# Patient Record
Sex: Male | Born: 1965
Health system: Southern US, Community
[De-identification: ages and names within clinical notes are randomized; demographics above are authoritative.]

## PROBLEM LIST (undated history)

## (undated) DIAGNOSIS — F419 Anxiety disorder, unspecified: Secondary | ICD-10-CM

## (undated) HISTORY — DX: Anxiety disorder, unspecified: F41.9

---

## 1998-02-20 ENCOUNTER — Emergency Department (HOSPITAL_COMMUNITY): Admission: EM | Admit: 1998-02-20 | Discharge: 1998-02-20 | Payer: Self-pay | Admitting: Emergency Medicine

## 2000-09-08 ENCOUNTER — Encounter: Payer: Self-pay | Admitting: Internal Medicine

## 2000-09-08 ENCOUNTER — Ambulatory Visit (HOSPITAL_COMMUNITY): Admission: RE | Admit: 2000-09-08 | Discharge: 2000-09-08 | Payer: Self-pay | Admitting: Internal Medicine

## 2002-10-12 HISTORY — PX: SHOULDER SURGERY: SHX246

## 2003-10-13 HISTORY — PX: FACIAL RECONSTRUCTION SURGERY: SHX631

## 2004-10-01 ENCOUNTER — Ambulatory Visit: Payer: Self-pay | Admitting: Internal Medicine

## 2005-06-25 ENCOUNTER — Ambulatory Visit: Payer: Self-pay | Admitting: Internal Medicine

## 2005-08-28 ENCOUNTER — Ambulatory Visit: Payer: Self-pay | Admitting: Internal Medicine

## 2006-03-30 ENCOUNTER — Ambulatory Visit: Payer: Self-pay | Admitting: Internal Medicine

## 2006-06-09 ENCOUNTER — Ambulatory Visit: Payer: Self-pay | Admitting: Internal Medicine

## 2006-08-05 ENCOUNTER — Ambulatory Visit: Payer: Self-pay | Admitting: Internal Medicine

## 2006-09-29 ENCOUNTER — Ambulatory Visit: Payer: Self-pay | Admitting: Internal Medicine

## 2007-01-06 ENCOUNTER — Ambulatory Visit: Payer: Self-pay | Admitting: Internal Medicine

## 2007-08-11 ENCOUNTER — Encounter (INDEPENDENT_AMBULATORY_CARE_PROVIDER_SITE_OTHER): Payer: Self-pay | Admitting: *Deleted

## 2008-01-31 ENCOUNTER — Emergency Department (HOSPITAL_COMMUNITY): Admission: EM | Admit: 2008-01-31 | Discharge: 2008-02-01 | Payer: Self-pay | Admitting: Emergency Medicine

## 2011-05-08 ENCOUNTER — Ambulatory Visit (INDEPENDENT_AMBULATORY_CARE_PROVIDER_SITE_OTHER): Payer: 59 | Admitting: Internal Medicine

## 2011-05-08 ENCOUNTER — Encounter: Payer: Self-pay | Admitting: Internal Medicine

## 2011-05-08 DIAGNOSIS — R5381 Other malaise: Secondary | ICD-10-CM

## 2011-05-08 MED ORDER — CITALOPRAM HYDROBROMIDE 40 MG PO TABS: 40.0000 mg | ORAL_TABLET | Freq: Every day | ORAL | Status: DC

## 2011-05-08 NOTE — Patient Instructions (Signed)
Assess response to the citalopram 40 mg daily. Use the nonmedical interventions as well to raise the neurotransmitters.

## 2011-05-08 NOTE — Progress Notes (Signed)
Subjective:    Patient ID: Jermaine Kidd, male    DOB: 1966/02/09, 45 y.o.   MRN: [id number]  HPI Malaise Onset: 02/2011   Fatigue @ rest : yes   ; not worse  with exertion  Primarily motivational fatigue: yes  Symptoms: Fever/ chills : no  Night sweats: no                                                                                           Vision changes ( blurred/ double/ loss): no                                                                                                  Hoarseness or swallowing dysfunction: no                                                                                          Bowel changes( constipation/ diarrhea): no                                                                                      Weight change: yes, 20 # over 2-3 months   Exertional chest pain: no  Dyspnea on exertion: no  Cough: no  Hemoptysis: no  New medications: no  Leg swelling: no  Orthopnea: no  PND: no  Melena/ rectal bleeding: no  Adenopathy: no  Severe snoring: no  Daytime sleepiness: no  Skin / hair / nail changes: no  Temperature intolerance( heat/ cold) : heat intolerance  Feeling depressed: yes, "probably"  Anhedonia: yes, "somewhat"  Altered appetite: yes  Poor sleep/ Apnea : yes, restless but no apnea  Abnormal bruising / bleeding or enlarged lymph nodes: no                                                                          PMH/ FH of thyroid disease: no    He had been on Lexapro 20 mg for at least a year and a half; in March dose was dropped to 10 mg.  Intermittently  he has had panic attacks.      Review of Systems labs done at an [facility] showed a platelet count 100,000 on  04/29/2011. Repeat platelet count on 7/26 was 224,000 all other labs were normal.  He denies a constellation of headache, chest pain, flushing, and  diarrhea.     Objective:   Physical Exam he is very healthy-appearing; weight is optimal and he is well muscled.  Extraocular motion is intact with no lid lag. There is no scleral icterus  Range of motion the neck is normal; thyroid is normal to palpation with normal physiologic asymmetry.  Chest is clear to auscultation with no increased work of breathing  He exhibits an S4 with no significant murmurs or gallops.  Abdomen is flat and well muscled. No organomegaly or masses are noted  Extremities reveal no edema, cyanosis, clubbing.  Range of motion is normal; except for flexion contracture of the left fifth digit. He has no tremor.  Strength and tone are excellent. Deep tendon reflexes are normal.  Affect is somewhat flat; exhibits no signs of anxiety.  He has no lymphadenopathy about the neck or axilla.             Assessment & Plan:   #1 malaise, and this is most likely related to serotonin deficiency in the context of a decreased dose of the SSRI  #2 thrombocytopenia; I believe this is a lab errorr. Clinically he has no evidence of such  Plan: The pathophysiology of serotonin deficiency was discussed. Options include assessing response to a recently increased dose of Lexapro or considering a dual agent to address potential  norepinephrine deficiency as well.

## 2011-07-07 ENCOUNTER — Encounter: Payer: Self-pay | Admitting: Internal Medicine

## 2011-07-07 ENCOUNTER — Ambulatory Visit (INDEPENDENT_AMBULATORY_CARE_PROVIDER_SITE_OTHER): Payer: 59 | Admitting: Internal Medicine

## 2011-07-07 DIAGNOSIS — R030 Elevated blood-pressure reading, without diagnosis of hypertension: Secondary | ICD-10-CM

## 2011-07-07 DIAGNOSIS — R519 Headache, unspecified: Secondary | ICD-10-CM

## 2011-07-07 DIAGNOSIS — R51 Headache: Secondary | ICD-10-CM

## 2011-07-07 MED ORDER — TRAMADOL HCL 50 MG PO TABS: 50.0000 mg | ORAL_TABLET | Freq: Four times a day (QID) | ORAL | Status: AC | PRN

## 2011-07-07 NOTE — Progress Notes (Signed)
Subjective:    Patient ID: Jermaine Kidd, male    DOB: 12/09/1965, 45 y.o.   MRN: [id number]  HPI #1He does not feel that citalopram has been of any benefit; he states "I'm just not happy". He denies any specific adverse effects with the medication.  #2 HEADACHE : Onset: 07/02/2011; onset during sexual relations   Location: over crown  Quality: severe pain 9/20, constant since Frequency: 24/7  Precipitating factors: no  Prior treatment: NSAIDS with temporary benefit Associated Symptoms Nausea/vomiting: no  Photophobia/phonophobia: no  Tearing of eyes: no  Sinus pain/pressure: no  Family/ PMH of migraine: no  Red Flags Fever: no  Neck pain/stiffness: no  Vision/speech/swallow/hearing difficulty: no  Focal weakness/numbness: no  Altered mental status: no  Trauma: no  Stress: marital issues New type of headache: yes,   Anticoagulant use: no       Review of Systems     Objective:   Physical Exam Gen.: Healthy and well-nourished in appearance. Alert, appropriate and cooperative throughout exam. Head: Normocephalic without obvious abnormalities Eyes: No corneal or conjunctival inflammation noted. Pupils equal round reactive to light and accommodation. FOV normal. Extraocular motion intact. Vision grossly normal. Ears: External  ear exam reveals no significant lesions or deformities. Canals clear .TMs normal. Hearing is grossly normal bilaterally. Tuning fork exam normal Nose: External nasal exam reveals no deformity or inflammation. Nasal mucosa are pink and moist. No lesions or exudates noted.  Mouth: Oral mucosa and oropharynx reveal no lesions or exudates. Teeth in good repair.Minimal asymmetry of nasolabial fold Neck: No deformities, masses, or tenderness noted. Range of motion &. Thyroid normal. Lungs: Normal respiratory effort; chest expands symmetrically. Lungs are clear to auscultation without rales, wheezes, or increased work of breathing. Heart: Normal rate and  rhythm. Normal S1 and S2. No gallop, click, or rub. No murmur.                                                                        Musculoskeletal/extremities: No deformity or scoliosis noted of  the thoracic or lumbar spine; minimal asymmetry of thoracic muscles. No clubbing, cyanosis, edema, or deformity noted. Tone & strength  normal.Joints normal. Nail health  good. Vascular: Carotid & radial artery pulses are full and equal. No bruits present. Neurologic: Alert and oriented x3. Deep tendon reflexes symmetrical and normal. Very minor tremor of the hands. Gait including heel and toe walking is normal. Romberg testing and finger to nose are normal as well         Skin: Intact without suspicious lesions or rashes. Lymph: No cervical, axillary lymphadenopathy present. Psych: Mood and affect are normal. Normally interactive                                                                                         Assessment & Plan:  #1 headaches over the cranium; no neurologic findings  #  2 mild diastolic hypertension, no past history of hypertension  #3 lack of benefit to SSRI  Plan: See orders and recommendations

## 2011-07-07 NOTE — Patient Instructions (Signed)
Blood Pressure Goal  Ideally is an AVERAGE < 135/85. This AVERAGE should be calculated from @ least 5-7 BP readings taken @ different times of day on different days of week. You should not respond to isolated BP readings , but rather the AVERAGE for that week  Please keep a diary of your headaches . Document  each occurrence on the calendar with notation of : #1 any prodrome ( any non headache symptom such as marked fatigue,visual changes, ,etc ) which precedes actual headache ; #2) severity on 1-10 scale; #3) any triggers ( food/ drink,enviromenntal or weather changes ,physical or emotional stress) in 8-12 hour period prior to the headache; & #4) response to any medications or other intervention. Please review "Headache" @ [facility] for additional information.

## 2011-07-14 ENCOUNTER — Ambulatory Visit (INDEPENDENT_AMBULATORY_CARE_PROVIDER_SITE_OTHER): Payer: 59 | Admitting: Internal Medicine

## 2011-07-14 ENCOUNTER — Encounter: Payer: Self-pay | Admitting: Internal Medicine

## 2011-07-14 DIAGNOSIS — M549 Dorsalgia, unspecified: Secondary | ICD-10-CM

## 2011-07-14 DIAGNOSIS — F419 Anxiety disorder, unspecified: Secondary | ICD-10-CM

## 2011-07-14 DIAGNOSIS — R51 Headache: Secondary | ICD-10-CM

## 2011-07-14 DIAGNOSIS — F411 Generalized anxiety disorder: Secondary | ICD-10-CM

## 2011-07-14 MED ORDER — VENLAFAXINE HCL ER 75 MG PO CP24: 75.0000 mg | ORAL_CAPSULE | Freq: Every day | ORAL | Status: DC

## 2011-07-14 MED ORDER — CYCLOBENZAPRINE HCL 5 MG PO TABS: 5.0000 mg | ORAL_TABLET | ORAL | Status: AC

## 2011-07-14 NOTE — Patient Instructions (Signed)
Referral will be made to the headache clinic if these studies are negative and you  do not respond to medication.

## 2011-07-14 NOTE — Progress Notes (Signed)
  Subjective:    Patient ID: Jermaine Kidd, male    DOB: Oct 02, 1966, 45 y.o.   MRN: [id number]  HPI  BACK & NECK PAIN: Location: Left trapezius area   Onset: 3-4 days ago  Severity: up to 5 Pain is described as: dull pain  Worse with: supine with head on pillow    Better with: no relievers  Pain radiates to:up neck & superior occiput   Impaired range of motion: no  History of repetitive motion:  yes, 60 reps with medicine ball with neck radiation  History of overuse or hyperextension:  no  History of trauma:  no   Past history of similar problem:  no   Symptoms Numbness/tingling:  no  Weakness:  no  Red Flags Fever:  no  Headache:  yes, occipital as noted . A major trigger for headaches appears to be stress. The record 9/25 was reviewed. He states that he was joking when he said the headaches began with sexual relations. He has found no definite trigger other than possible stress and relationship to the back and neck pain. At this time the headaches seem to be associated with the upper back and neck symptoms rather than a separate problem. The Ultram did not help the headaches would simply cause constipation Bowel/bladder dysfunction:  no      Review of Systems     Objective:   Physical Exam  He is healthy and well muscled and in no acute distress. He describes discomfort related to the headaches. Pupils are equal round and reactive to light. Extraocular motion and field of vision are normal.  Cranial nerve exam is normal except for slight asymmetry of the left nasolabial fold.  He has no lymphadenopathy about the neck. Range of motion of the neck is normal.  There's no tenderness to palpation of the trapezius.  Deep tendon reflexes and strength are normal and extremities.  Gait, Romberg, and finger-nose testing are normal        Assessment & Plan:  #1 occipital headaches, essentially present approximately 70 % of the time by history. Neurologic exam remains  negative except for minimal nasolabial asymmetry. Because the duration and severity of symptoms, imaging was appropriate.  #2 back and neck pain, musculoskeletal  Plan: See orders and recommendations

## 2011-07-16 NOTE — Progress Notes (Signed)
Addended by: Candie Echevaria L on: 07/16/2011 09:04 AM   Modules accepted: Orders

## 2011-07-20 ENCOUNTER — Other Ambulatory Visit: Payer: 59

## 2012-07-05 ENCOUNTER — Ambulatory Visit: Payer: 59

## 2012-07-21 ENCOUNTER — Encounter: Payer: Self-pay | Admitting: Internal Medicine

## 2012-07-21 ENCOUNTER — Ambulatory Visit (INDEPENDENT_AMBULATORY_CARE_PROVIDER_SITE_OTHER): Payer: 59 | Admitting: Internal Medicine

## 2012-07-21 VITALS — BP 130/80 | HR 86 | Wt 169.6 lb

## 2012-07-21 DIAGNOSIS — F411 Generalized anxiety disorder: Secondary | ICD-10-CM

## 2012-07-21 DIAGNOSIS — B079 Viral wart, unspecified: Secondary | ICD-10-CM

## 2012-07-21 DIAGNOSIS — F419 Anxiety disorder, unspecified: Secondary | ICD-10-CM | POA: Insufficient documentation

## 2012-07-21 MED ORDER — DULOXETINE HCL 60 MG PO CPEP: 60.0000 mg | ORAL_CAPSULE | Freq: Every day | ORAL | Status: DC

## 2012-07-21 NOTE — Patient Instructions (Addendum)
If you activate My Chart; the results can be released to you as soon as they populate from the lab. If you choose not to use this program; the labs have to be reviewed, copied & mailed   causing a delay in getting the results to you. Please schedule a physical

## 2012-07-21 NOTE — Assessment & Plan Note (Signed)
Trial of Cymbalta; titrate as tolerated

## 2012-07-21 NOTE — Progress Notes (Signed)
  Subjective:    Patient ID: Jermaine Kidd, male    DOB: 1965/10/28, 46 y.o.   MRN: [id number]  HPI He does not feel that the Lexapro is effective in controlling anxiety or panic attacks time. He does state he does not want to go up on the dose. He's questioning other medication options.  He denies depression or suicidal ideation. He is not abusing alcohol or drugs   Review of Systems   He describes some decrease in near vision. He denies GERD vision, double vision,or loss of vision. He is not having palpitations, chest pain, flushing, or diarrhea.  There have been no new change in his hair skin or nails     Objective:   Physical Exam  Gen.:  well-nourished; in no acute distress Eyes: Extraocular motion intact; no lid lag or proptosis Neck: normal thyroid Heart: Normal rhythm and rate without significant murmur, gallop, or extra heart sounds Lungs: Chest clear to auscultation without rales,rales, wheezes Neuro:Deep tendon reflexes are equal and very brisk; no tremor  Skin: Warm and dry without significant lesions or rashes; no onycholysis Psych: Normally communicative and interactive; no abnormal mood or affect clinically.         Assessment & Plan:

## 2012-08-24 ENCOUNTER — Ambulatory Visit (INDEPENDENT_AMBULATORY_CARE_PROVIDER_SITE_OTHER): Payer: 59 | Admitting: Internal Medicine

## 2012-08-24 ENCOUNTER — Encounter: Payer: Self-pay | Admitting: Internal Medicine

## 2012-08-24 VITALS — BP 112/80 | HR 99 | Temp 98.0°F | Wt 165.8 lb

## 2012-08-24 DIAGNOSIS — F419 Anxiety disorder, unspecified: Secondary | ICD-10-CM

## 2012-08-24 DIAGNOSIS — F411 Generalized anxiety disorder: Secondary | ICD-10-CM

## 2012-08-24 DIAGNOSIS — H539 Unspecified visual disturbance: Secondary | ICD-10-CM

## 2012-08-24 DIAGNOSIS — N529 Male erectile dysfunction, unspecified: Secondary | ICD-10-CM

## 2012-08-24 DIAGNOSIS — I479 Paroxysmal tachycardia, unspecified: Secondary | ICD-10-CM

## 2012-08-24 MED ORDER — SILDENAFIL CITRATE 100 MG PO TABS: 50.0000 mg | ORAL_TABLET | Freq: Every day | ORAL | Status: DC | PRN

## 2012-08-24 MED ORDER — LORAZEPAM 0.5 MG PO TABS: ORAL_TABLET | ORAL | Status: DC

## 2012-08-24 NOTE — Patient Instructions (Addendum)
The urine studies will verify if there is  increased production of stress hormones (metanephrines and catecholamines).   If you activate My Chart; the results can be released to you as soon as they populate from the lab. If you choose not to use this program; the labs have to be reviewed, copied & mailed   causing a delay in getting the results to you.

## 2012-08-24 NOTE — Progress Notes (Signed)
  Subjective:    Patient ID: Jermaine Kidd, male    DOB: 1966/01/29, 46 y.o.   MRN: [id number]  HPI He describes some erectile dysfunction over the last 9-10 months; he denies decreased libido or generalized weakness.   Review of Systems  Cymbalta has been of benefit for his "racing mind". He still describes some intermittent anxiety.He has had rare panic attacks. Depression:no Loss of interest (Anhedonia):no Insomnia:intermittently Anorexia:no Fatigue:no Neurologic signs/symptoms: No headache, numbness and tingling, weakness Endocrinologic signs and symptoms: No hoarseness, weight change,  temperature intolerance, bowel changes, skin/hair,/nail changes.   He has noted some blurred vision and loss of vision  Medications/efficacy: See Problem List     Intermittent hemorrhoidal flares.     Objective:   Physical Exam Gen.: Healthy and well-nourished in appearance. Alert, appropriate and cooperative throughout exam. Head: Normocephalic without obvious abnormalities Eyes: No corneal or conjunctival inflammation noted. No lid lag or proptosis. Extraocular motion intact. Vision grossly normal.  Neck: No deformities, masses, or tenderness noted. Range of motion & Thyroid  normal. Lungs: Normal respiratory effort; chest expands symmetrically. Lungs are clear to auscultation without rales, wheezes, or increased work of breathing. Heart: Normal rate and rhythm. Normal S1 and S2. No gallop, click, or rub. During  the cardiac examination he developed a tachycardia with no murmur. This quickly resolved Abdomen: Bowel sounds normal; abdomen soft and nontender. No masses, organomegaly or hernias noted. Genitalia: Genitalia normal except for left granuloma   .                                                                                   Musculoskeletal/extremities:  No clubbing, cyanosis, edema, or deformity noted. Range of motion  normal .Tone & strength  normal.Joints normal. Nail health   good. Vascular: Carotid, radial artery, dorsalis pedis and  posterior tibial pulses are full and equal. No bruits present. Neurologic: Alert and oriented x3. Deep tendon reflexes symmetrical and normal. No tremor        Skin: Intact without suspicious lesions or rashes. Lymph: No cervical, axillary, or inguinal lymphadenopathy present. Psych: Mood and affect are normal. Normally interactive                                                                                        Assessment & Plan:   #1 ED #2 anxiety disorder with rare panic attacks  #3 subjective visual changes  # 4 paroxysmal tachycardia  Plan: See orders and recommendations

## 2012-08-30 ENCOUNTER — Other Ambulatory Visit: Payer: Self-pay

## 2012-08-30 NOTE — Telephone Encounter (Signed)
#  6 R X 5

## 2012-08-30 NOTE — Telephone Encounter (Signed)
Pt LMOVM stating had samples for Viagra now out and requesting Viagra Rx sent to [facility]. OV and last filled 08/24/12 PLz advise   MW

## 2012-08-31 MED ORDER — SILDENAFIL CITRATE 100 MG PO TABS: 50.0000 mg | ORAL_TABLET | Freq: Every day | ORAL | Status: DC | PRN

## 2012-09-27 ENCOUNTER — Telehealth: Payer: Self-pay | Admitting: Internal Medicine

## 2012-09-27 NOTE — Telephone Encounter (Signed)
I called patient and got recording " [employer] customer is not available, try call again later"

## 2012-09-27 NOTE — Telephone Encounter (Signed)
Patient states he has a question regarding the quantity of pills he is receiving on one of his prescriptions. He would like to get more pills, but did not specify what medication.

## 2012-09-28 NOTE — Telephone Encounter (Signed)
I called patient again and received same recording

## 2012-09-29 MED ORDER — TADALAFIL 20 MG PO TABS: ORAL_TABLET | ORAL | Status: DC

## 2012-09-29 NOTE — Telephone Encounter (Signed)
Refill done.  

## 2012-09-29 NOTE — Telephone Encounter (Signed)
Spoke with patient, patient will contact insurance company and call us back

## 2012-09-29 NOTE — Telephone Encounter (Signed)
Which agent will his company cover ; other option is [place]. [facility] [place] [phone] (toll-free).

## 2012-09-29 NOTE — Telephone Encounter (Signed)
Patient would like Cialis

## 2012-09-29 NOTE — Telephone Encounter (Signed)
Cialis 20 mg  1 every 3 days prn ; how many does he want

## 2012-09-29 NOTE — Telephone Encounter (Signed)
Called work number, unable to reach patient. Tried patient again on cell. Patient is requesting a different ED medication, patient states insurance will only give him # 3 pills per month and he uses 3 in 1 week. Patient would like another medication   Hopp please advise

## 2012-10-15 ENCOUNTER — Other Ambulatory Visit: Payer: Self-pay | Admitting: Internal Medicine

## 2012-11-02 ENCOUNTER — Other Ambulatory Visit: Payer: Self-pay | Admitting: Internal Medicine

## 2012-11-02 MED ORDER — DULOXETINE HCL 60 MG PO CPEP: 60.0000 mg | ORAL_CAPSULE | Freq: Every day | ORAL | Status: DC

## 2012-11-02 NOTE — Telephone Encounter (Signed)
RX sent

## 2012-11-02 NOTE — Telephone Encounter (Signed)
refill CYMBALTA 60 MG Take 1 capsule (60 mg total) by mouth daily.-- requesting 90-day supply no last fill date listed  Note NEW pharmacy is now [facility] updated demographics

## 2012-11-07 ENCOUNTER — Other Ambulatory Visit: Payer: Self-pay | Admitting: Internal Medicine

## 2013-04-02 ENCOUNTER — Encounter (HOSPITAL_BASED_OUTPATIENT_CLINIC_OR_DEPARTMENT_OTHER): Payer: Self-pay | Admitting: *Deleted

## 2013-04-02 ENCOUNTER — Emergency Department (HOSPITAL_BASED_OUTPATIENT_CLINIC_OR_DEPARTMENT_OTHER): Payer: 59

## 2013-04-02 ENCOUNTER — Emergency Department (HOSPITAL_BASED_OUTPATIENT_CLINIC_OR_DEPARTMENT_OTHER)
Admission: EM | Admit: 2013-04-02 | Discharge: 2013-04-02 | Disposition: A | Discharge status: Home / Self Care | Payer: 59 | Attending: Emergency Medicine | Admitting: Emergency Medicine

## 2013-04-02 DIAGNOSIS — M542 Cervicalgia: Secondary | ICD-10-CM | POA: Insufficient documentation

## 2013-04-02 DIAGNOSIS — R51 Headache: Secondary | ICD-10-CM | POA: Insufficient documentation

## 2013-04-02 DIAGNOSIS — F411 Generalized anxiety disorder: Secondary | ICD-10-CM | POA: Insufficient documentation

## 2013-04-02 DIAGNOSIS — Z79899 Other long term (current) drug therapy: Secondary | ICD-10-CM | POA: Insufficient documentation

## 2013-04-02 MED ORDER — ONDANSETRON HCL 4 MG/2ML IJ SOLN
4.0000 mg | Freq: Once | INTRAMUSCULAR | Status: AC
  Administered 2013-04-02: 4 mg via INTRAVENOUS
  Filled 2013-04-02: qty 2

## 2013-04-02 MED ORDER — METOCLOPRAMIDE HCL 5 MG/ML IJ SOLN
10.0000 mg | Freq: Once | INTRAMUSCULAR | Status: AC
  Administered 2013-04-02: 10 mg via INTRAVENOUS
  Filled 2013-04-02: qty 2

## 2013-04-02 MED ORDER — DIPHENHYDRAMINE HCL 50 MG/ML IJ SOLN
25.0000 mg | Freq: Once | INTRAMUSCULAR | Status: AC
  Administered 2013-04-02: 15:00:00 via INTRAVENOUS
  Filled 2013-04-02: qty 1

## 2013-04-02 MED ORDER — HYDROCODONE-ACETAMINOPHEN 5-325 MG PO TABS: 2.0000 | ORAL_TABLET | ORAL | Status: DC | PRN

## 2013-04-02 MED ORDER — DEXAMETHASONE SODIUM PHOSPHATE 10 MG/ML IJ SOLN
10.0000 mg | Freq: Once | INTRAMUSCULAR | Status: AC
  Administered 2013-04-02: 10 mg via INTRAVENOUS
  Filled 2013-04-02: qty 1

## 2013-04-02 MED ORDER — HYDROMORPHONE HCL PF 1 MG/ML IJ SOLN
1.0000 mg | Freq: Once | INTRAMUSCULAR | Status: AC
  Administered 2013-04-02: 1 mg via INTRAVENOUS
  Filled 2013-04-02: qty 1

## 2013-04-02 NOTE — ED Notes (Signed)
Sudden onset of severe headache appx 40 minutes ago. States that it is coming from the back of his neck. Also states that his left arm is tingling.

## 2013-04-02 NOTE — ED Provider Notes (Signed)
Medical screening examination/treatment/procedure(s) were conducted as a shared visit with non-physician practitioner(s) and myself.  I personally evaluated the patient during the encounter   Pt with sudden onset of HA during intercourse.  Pt has no photophobia, no fever, no meningismus on exam.  Non contrast head CT is normal.  Discussed with Dr. Jena Gauss as well who does not recommend MRI or CT with contrast.  Discussed LP after CT is gold standard, althouh I have less clinical suspicion given exam is benign and pt is well appearing overall.  Given risk versus benefits of LP such as post LP headache, false positive or traumatic tap, infection, bleeding - pt does not wish to pursue LP at this time.    Pt with h/o anxiety in the past, is on cymbalta and ativan.  Impression: HA  Jermaine Kidd. Jermaine Brunelle, MD 04/02/13 1454

## 2013-04-02 NOTE — ED Notes (Signed)
Family member asking how much longer until patient will be seen states is hurting so bad nearly in tears. Informed pts family member that provider signed up to see them so should not be much longer

## 2013-04-02 NOTE — ED Notes (Signed)
Patient transported to CT 

## 2013-04-02 NOTE — ED Provider Notes (Signed)
History     CSN: [id number]  Arrival date & time 04/02/13  1158   First MD Initiated Contact with Patient 04/02/13 1300      Chief Complaint  Patient presents with  . Headache    (Consider location/radiation/quality/duration/timing/severity/associated sxs/prior treatment) Patient is a 47 y.o. male presenting with headaches. The history is provided by the patient. No language interpreter was used.  Headache Pain location:  Occipital Radiates to:  L neck Severity currently:  10/10 Severity at highest:  10/10 Timing:  Constant Progression:  Worsening Chronicity:  New Context comment:  Pt was having intercourse Relieved by:  Nothing Associated symptoms: neck pain     Past Medical History  Diagnosis Date  . Anxiety disorder     onset age34    Past Surgical History  Procedure Laterality Date  . Shoulder surgery    . Facial reconstruction surgery      baseball injury    Family History  Problem Relation Age of Onset  . Alzheimer's disease Paternal Grandmother   . Alzheimer's disease Maternal Aunt     History  Substance Use Topics  . Smoking status: Never Smoker   . Smokeless tobacco: Not on file  . Alcohol Use: No      Review of Systems  HENT: Positive for neck pain.   Neurological: Positive for headaches.  All other systems reviewed and are negative.    Allergies  Review of patient's allergies indicates no known allergies.  Home Medications   Current Outpatient Rx  Name  Route  Sig  Dispense  Refill  . AMBULATORY NON FORMULARY MEDICATION      Protein powder: twice daily and post working out          . AMBULATORY NON FORMULARY MEDICATION      Charger Night (selinuim) one at night          . AMBULATORY NON FORMULARY MEDICATION      Charger Drive (N8/G95): 1 by mouth twice daily          . AMBULATORY NON FORMULARY MEDICATION      Charger cuts: for lean muscle mas and decrease fats          . DULoxetine (CYMBALTA) 60 MG capsule      1 by mouth daily    30 capsule   0   . LORazepam (ATIVAN) 0.5 MG tablet      1 q 8-12 hrs prn only   30 tablet   2   . Multiple Vitamin (MULTIVITAMIN) tablet   Oral   Take 1 tablet by mouth daily.          . Nutritional Supplements (DHEA PO)   Oral   Take by mouth 3 (three) times daily.           . tadalafil (CIALIS) 20 MG tablet      Take 1 tablet every 3 days as needed.   10 tablet   0   . VIAGRA 100 MG tablet      TAKE 1/2 TO 1 TABLET BY MOUTH DAILY AS NEEDED FOR ERECTILE DYSFUNCTION   6 tablet   4     Approved on 08/30/12     BP 145/90  Pulse 64  Temp(Src) 97.6 F (36.4 C) (Oral)  Resp 20  SpO2 100%  Physical Exam  Vitals reviewed. Constitutional: He is oriented to person, place, and time. He appears well-developed and well-nourished.  HENT:  Head: Normocephalic.  Right Ear: External ear normal.  Left Ear: External ear normal.  Nose: Nose normal.  Mouth/Throat: Oropharynx is clear and moist.  Eyes: Conjunctivae and EOM are normal. Pupils are equal, round, and reactive to light.  Neck: Normal range of motion. Neck supple.  Cardiovascular: Normal rate.   Pulmonary/Chest: Effort normal.  Abdominal: Soft.  Musculoskeletal: Normal range of motion.  Neurological: He is alert and oriented to person, place, and time. He has normal reflexes.  Skin: Skin is warm.  Psychiatric: He has a normal mood and affect.    ED Course  Procedures (including critical care time)  Labs Reviewed - No data to display Ct Head Wo Contrast  04/02/2013   *RADIOLOGY REPORT*  Clinical Data: Acute onset of severe headache.  CT HEAD WITHOUT CONTRAST  Technique:  Contiguous axial images were obtained from the base of the skull through the vertex without contrast.  Comparison: None.  Findings: There is no acute intracranial hemorrhage, infarction, or mass lesion.  Brain parenchyma is normal.  Osseous structures are normal except for a tiny retention cyst in the right maxillary  sinus.  IMPRESSION: Essentially normal exam.  Specifically, no evidence of subarachnoid hemorrhage.   Original Report Authenticated By: Francene Boyers, M.D.     1. Headache       MDM  Ct scan no evidence of subarachnoid.    Pt feels better after migrane cocktail.   Pt reports headache resolved.   Dr. Oletta Lamas in to see and examine.   Pt declined LP.   Pt advised if headache returns symptoms worsen.          Lonia Skinner [place], PA-C 04/03/13 0007

## 2013-05-03 ENCOUNTER — Other Ambulatory Visit: Payer: Self-pay

## 2013-05-03 MED ORDER — DULOXETINE HCL 60 MG PO CPEP: ORAL_CAPSULE | ORAL | Status: DC

## 2013-05-03 NOTE — Telephone Encounter (Signed)
RX sent via paper fax received by patient (from [facility])

## 2013-06-26 IMAGING — CT CT HEAD W/O CM
1 series · 16 of 30 positions shown, 20 images · non-contrast
Comparison: None.

CLINICAL DATA: Acute onset of severe headache.

CT HEAD WITHOUT CONTRAST
TECHNIQUE: Contiguous axial images were obtained from the base of
the skull through the vertex without contrast.

[Series 2: head 4.8 h37s · axial · 0.46mm/px · z∈[+1248,+1388]mm · 16 of 32 slices shown, 20 images]
[im 2/32  brain]
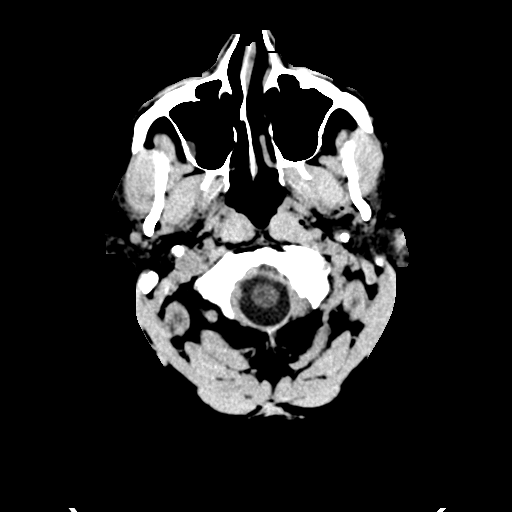
[im 2/32  bone]
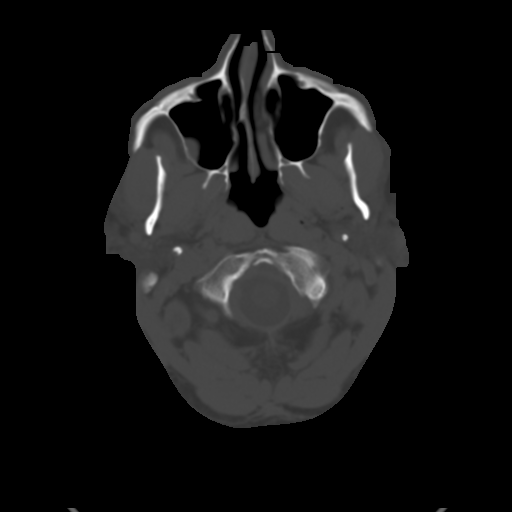
[im 4/32  brain]
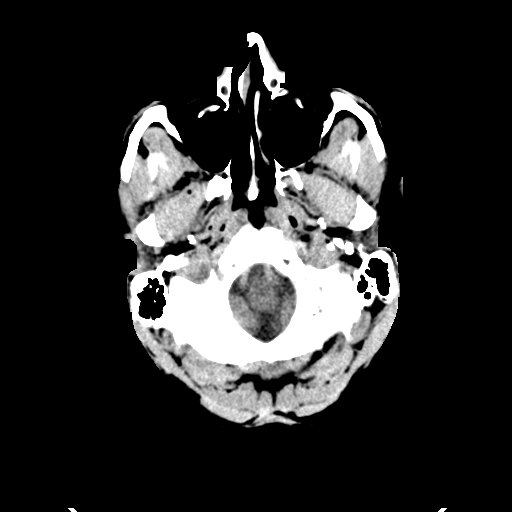
[im 6/32  brain]
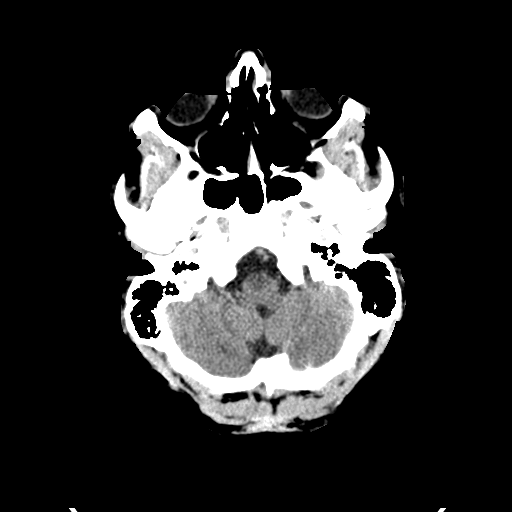
[im 8/32  brain]
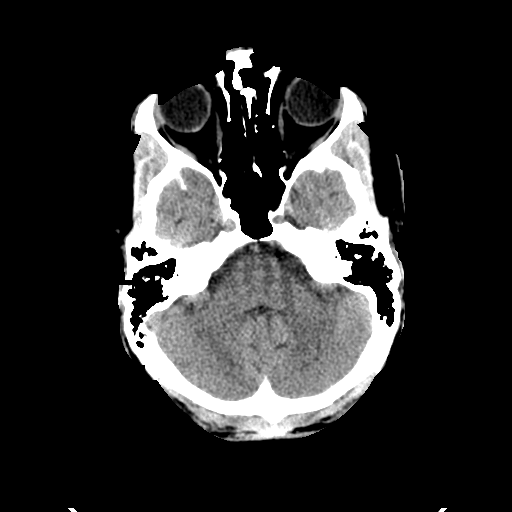
[im 9/32  brain]
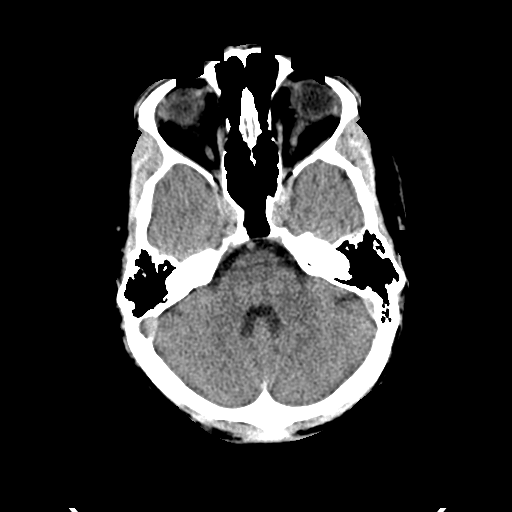
[im 9/32  bone]
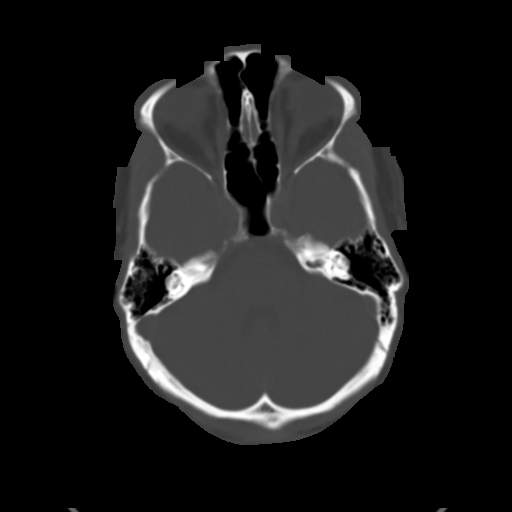
[im 11/32  brain]
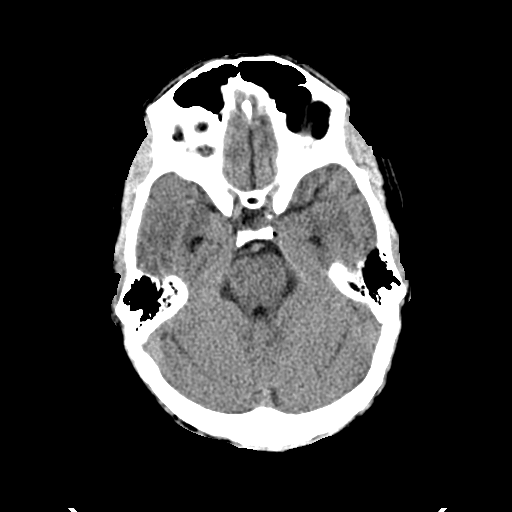
[im 13/32  brain]
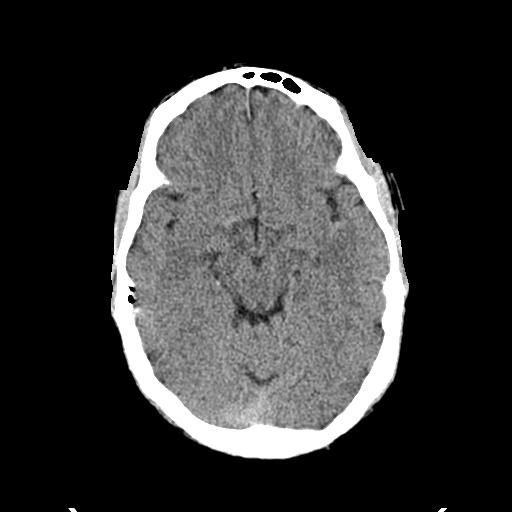
[im 15/32  brain]
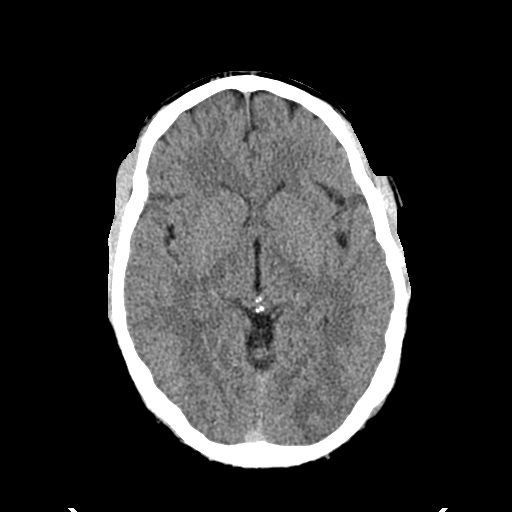
[im 17/32  brain]
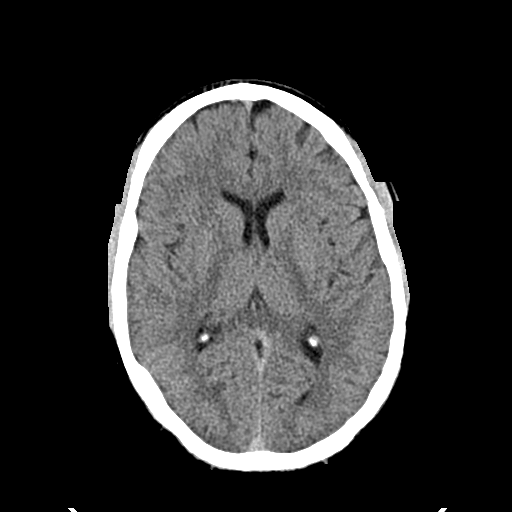
[im 17/32  bone]
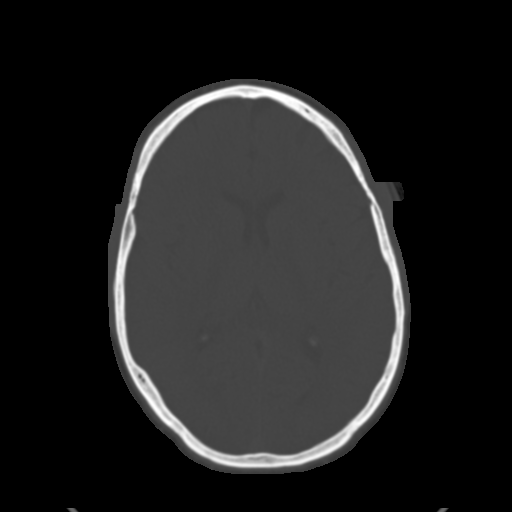
[im 19/32  brain]
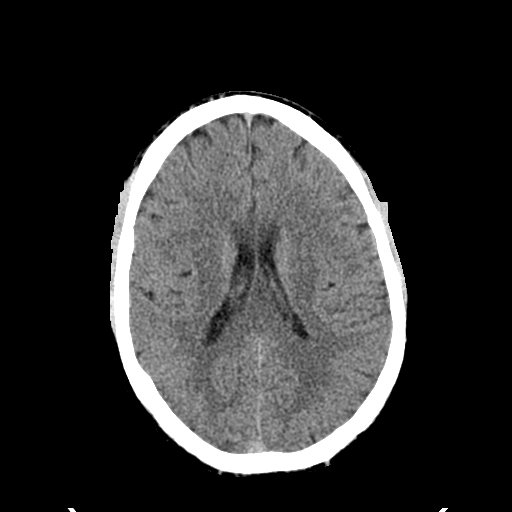
[im 21/32  brain]
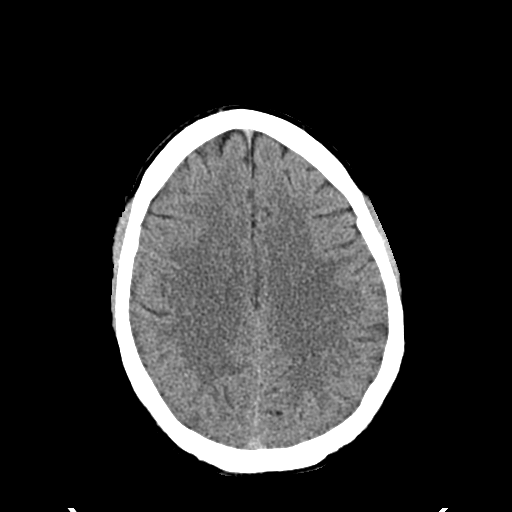
[im 23/32  brain]
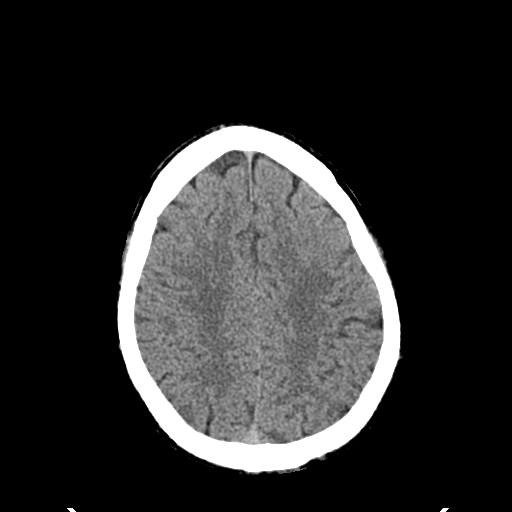
[im 24/32  brain]
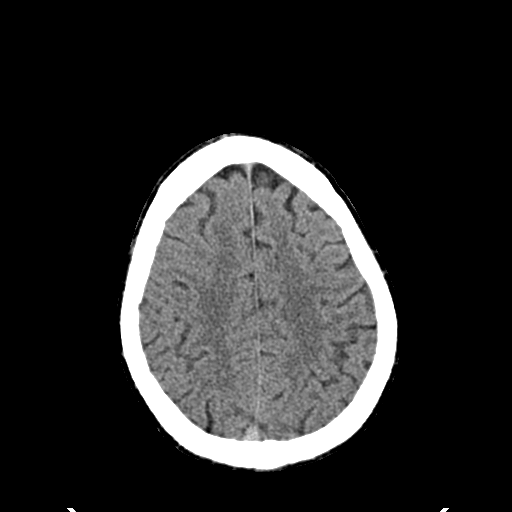
[im 24/32  bone]
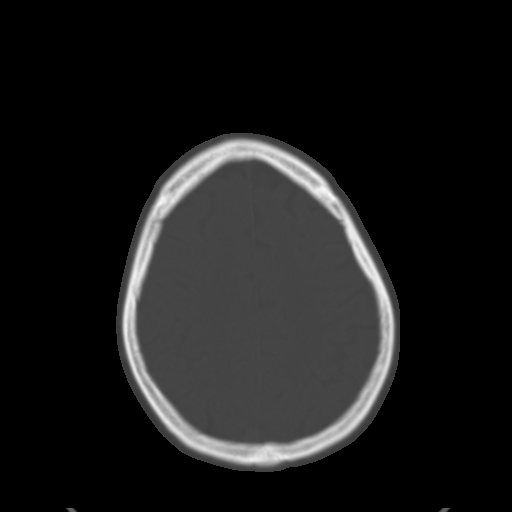
[im 26/32  brain]
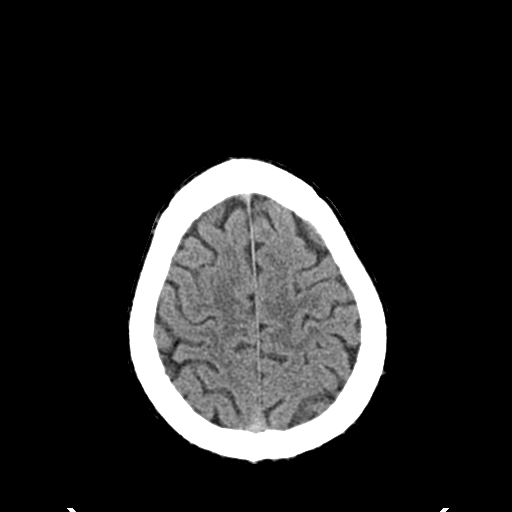
[im 28/32  brain]
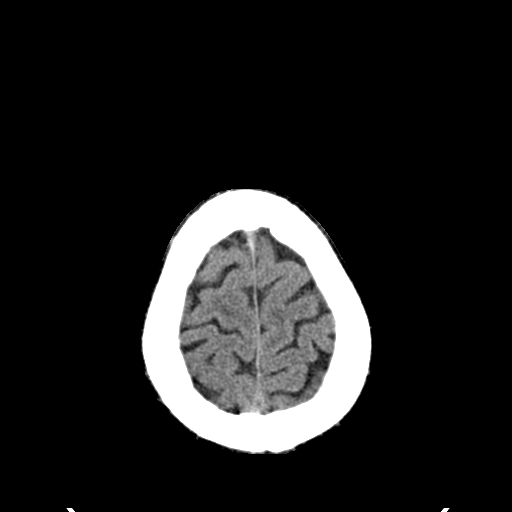
[im 30/32  brain]
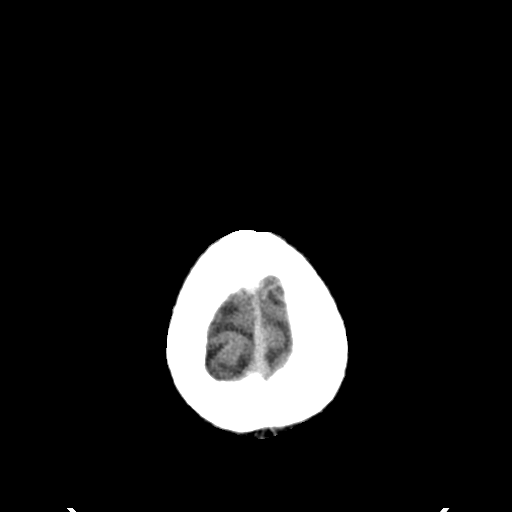

[16 of 30 positions shown; findings below may reference images not displayed]

FINDINGS: There is no acute intracranial hemorrhage, infarction, or
mass lesion.  Brain parenchyma is normal.  Osseous structures are
normal except for a tiny retention cyst in the right maxillary
sinus.
IMPRESSION: Essentially normal exam.  Specifically, no evidence of subarachnoid
hemorrhage.

## 2013-08-15 ENCOUNTER — Encounter: Payer: Self-pay | Admitting: Internal Medicine

## 2013-08-15 ENCOUNTER — Ambulatory Visit (INDEPENDENT_AMBULATORY_CARE_PROVIDER_SITE_OTHER): Payer: 59 | Admitting: Internal Medicine

## 2013-08-15 VITALS — BP 134/83 | HR 98 | Temp 98.5°F

## 2013-08-15 DIAGNOSIS — F411 Generalized anxiety disorder: Secondary | ICD-10-CM

## 2013-08-15 DIAGNOSIS — F419 Anxiety disorder, unspecified: Secondary | ICD-10-CM

## 2013-08-15 MED ORDER — VORTIOXETINE HBR 10 MG PO TABS: 10.0000 mg | ORAL_TABLET | ORAL | Status: DC

## 2013-08-15 NOTE — Progress Notes (Signed)
  Subjective:    Patient ID: Jermaine Kidd, male    DOB: 04-Jul-1966, 47 y.o.   MRN: [id number]  HPI  Over the last 1.5-2 months he has felt less happy and more anxious despite taking generic Cymbalta 60 mg daily. It ran out approximately a week ago. He states he feels worse off the medication.  He describes increased worrying & anxiety. He denies frank depression but describes himself as feeling "blah" there is no suicidal ideation.  He previously been on Paxil but this caused weight gain. He felt he became intolerant of Lexapro. He felt Effexor  did not work.  In November 2013 24 urine for metanephrines and catecholamines were recommended along with free T4 and TSH. These were never completed.    Review of Systems  No difficulty focusing;no PMH of behavioral issues  Weight stable. Intermittent sleep disruption as frequent awakenings.Some fatigue w/o  weakness Occasional heart racing or skipping @ rest.No sweating No nausea or vomiting,loss of appetite,constipation, diarrhea No tremor No change in skin/hair/nails  He denies a constellation of headaches, flushing, chest pain, & diarrhea        Objective:   Physical Exam Appears healthy and well-nourished & in no acute distress  No lid lag; EOM intact  No carotid bruits are present.No neck pain distention present at 10 - 15 degrees. Thyroid normal to palpation  Heart rhythm and rate are normal with no significant murmurs or gallops.  Chest is clear with no increased work of breathing  There is no evidence of aortic aneurysm or renal artery bruits  Abdomen well muscled with no organomegaly or masses. No HJR  No clubbing, cyanosis or edema present.  Pedal pulses are intact   No ischemic skin changes are present . Nails healthy without onycholysis   Alert and oriented. Strength, tone, DTRs reflexes normal. No tremor           Assessment & Plan:  #1 anxiety with subjective intolerance to generic Cymbalta  Plan:  See orders

## 2013-08-15 NOTE — Patient Instructions (Addendum)
Your next office appointment will be determined based upon review of your pending labs . Those instructions will be transmitted to you through My Chart  or by mail if you're not using this system.   Please report any significant change in your symptoms. 

## 2013-08-20 ENCOUNTER — Encounter: Payer: Self-pay | Admitting: Internal Medicine

## 2013-08-21 ENCOUNTER — Telehealth: Payer: Self-pay | Admitting: Internal Medicine

## 2013-08-21 NOTE — Telephone Encounter (Signed)
Patient called and wanted to see if dr hopper would write him a rx for DULoxetine (CYMBALTA) 60 MG capsule but for a higher dosage. He also stated that dr hopper prescribe him something else in place of the cymbalta but he states that it dose not work.    Pharmacy [id number] - [place], [place] - [address] AT [address]

## 2013-08-22 NOTE — Telephone Encounter (Signed)
Patient is calling back to check the status of his request below. States that he has been out for 3 days. Please advise.

## 2013-08-23 ENCOUNTER — Other Ambulatory Visit: Payer: Self-pay | Admitting: Internal Medicine

## 2013-08-23 MED ORDER — DULOXETINE HCL 60 MG PO CPEP: ORAL_CAPSULE | ORAL | Status: DC

## 2013-08-23 NOTE — Telephone Encounter (Signed)
Please advise 

## 2013-08-23 NOTE — Telephone Encounter (Signed)
Med refilled.

## 2013-08-23 NOTE — Telephone Encounter (Signed)
Stop Brintellix; restart Cymbalta 60 mg qd. This is max dose; only Psychiatrists can prescribe larger dose would require . See letter

## 2013-09-30 ENCOUNTER — Other Ambulatory Visit: Payer: Self-pay | Admitting: Internal Medicine

## 2013-11-02 ENCOUNTER — Other Ambulatory Visit: Payer: Self-pay | Admitting: Internal Medicine

## 2013-11-06 NOTE — Telephone Encounter (Signed)
Viagra refilled per protocol. JG//CMA

## 2013-11-10 ENCOUNTER — Other Ambulatory Visit: Payer: Self-pay | Admitting: Internal Medicine

## 2013-11-10 NOTE — Telephone Encounter (Signed)
Duloxetine refilled per protocol. JG//CMA 

## 2014-01-12 ENCOUNTER — Other Ambulatory Visit: Payer: Self-pay | Admitting: Internal Medicine

## 2014-01-25 ENCOUNTER — Other Ambulatory Visit: Payer: Self-pay | Admitting: Internal Medicine

## 2014-01-26 NOTE — Telephone Encounter (Signed)
Last OV 08/15/2013 Med last filled 11/10/13 #90 no refills

## 2014-02-19 ENCOUNTER — Other Ambulatory Visit (INDEPENDENT_AMBULATORY_CARE_PROVIDER_SITE_OTHER): Payer: 59

## 2014-02-19 ENCOUNTER — Other Ambulatory Visit: Payer: Self-pay | Admitting: Internal Medicine

## 2014-02-19 ENCOUNTER — Telehealth: Payer: Self-pay

## 2014-02-19 DIAGNOSIS — F411 Generalized anxiety disorder: Secondary | ICD-10-CM

## 2014-02-19 LAB — T4, FREE: FREE T4: 0.94 ng/dL (ref 0.60–1.60)

## 2014-02-19 LAB — TSH: TSH: 0.86 u[IU]/mL (ref 0.35–4.50)

## 2014-02-19 NOTE — Telephone Encounter (Signed)
Lab called requesting order be placed, patient is in the lab now.

## 2014-02-19 NOTE — Telephone Encounter (Signed)
Patient called [facility] requesting to have lab orders entered. Patient last seen 08/15/13 with no upcoming appointment scheduled, unsure what labs are needed. Thanks

## 2014-02-19 NOTE — Telephone Encounter (Signed)
Orders in chart from 11/14

## 2014-02-19 NOTE — Telephone Encounter (Signed)
Pt.notified

## 2014-02-20 ENCOUNTER — Encounter: Payer: Self-pay | Admitting: Internal Medicine

## 2014-02-22 ENCOUNTER — Ambulatory Visit (INDEPENDENT_AMBULATORY_CARE_PROVIDER_SITE_OTHER): Payer: 59 | Admitting: Internal Medicine

## 2014-02-22 ENCOUNTER — Encounter: Payer: Self-pay | Admitting: Internal Medicine

## 2014-02-22 VITALS — BP 110/78 | HR 96 | Temp 98.4°F | Resp 14 | Wt 180.8 lb

## 2014-02-22 DIAGNOSIS — R5381 Other malaise: Secondary | ICD-10-CM

## 2014-02-22 DIAGNOSIS — R4681 Obsessive-compulsive behavior: Secondary | ICD-10-CM

## 2014-02-22 DIAGNOSIS — F429 Obsessive-compulsive disorder, unspecified: Secondary | ICD-10-CM

## 2014-02-22 DIAGNOSIS — R5383 Other fatigue: Secondary | ICD-10-CM

## 2014-02-22 DIAGNOSIS — M6281 Muscle weakness (generalized): Secondary | ICD-10-CM

## 2014-02-22 DIAGNOSIS — N529 Male erectile dysfunction, unspecified: Secondary | ICD-10-CM

## 2014-02-22 NOTE — Progress Notes (Signed)
Pre visit review using our clinic review tool, if applicable. No additional management support is needed unless otherwise documented below in the visit note. 

## 2014-02-22 NOTE — Progress Notes (Signed)
Subjective:    Patient ID: Jermaine Kidd, male    DOB: 1966/09/08, 48 y.o.   MRN: [id number]  HPI  He is concerned that he has  testosterone deficiency; this is based on reviewing the data on Web M.D. He has multiple symptoms that he found on the list for testosterone deficiency.  Thyroid function tests were normal. To date he has not completed the 24 urine for catecholamines and metanephrines.     Review of Systems  He has difficulty building muscle mass despite taking creatine and other non steroidal or growth hormone supplements. He also describes significant fatigue at night.  He has difficulty getting and keeping erection.  He describes feeling more obsessive ,irritable & panicky despite taking the Cymbalta. He repeatedly washes his hands and does not touch doorknobs or pins used by the general public. Also he'll not touch the condiments in restaurants. He does recheck the doors after they had been locked at night.      Objective:   Physical Exam Gen.: Healthy, well muscled and well-nourished in appearance. Alert, appropriate and cooperative throughout exam. He obviously is uncomfortable discussing his symptoms related to possible obsessive behavior. Appears younger than stated age  Head: Normocephalic without obvious abnormalities Eyes: No corneal or conjunctival inflammation noted. Pupils equal round reactive to light and accommodation. Extraocular motion intact. No lid lag or proptosis  Nose: External nasal exam reveals no deformity or inflammation. Nasal mucosa are pink and moist. No lesions or exudates noted.   Mouth: Oral mucosa and oropharynx reveal no lesions or exudates. Teeth in good repair. Neck: No deformities, masses, or tenderness noted. Range of motion and Thyroid normal. Lungs: Normal respiratory effort; chest expands symmetrically. Lungs are clear to auscultation without rales, wheezes, or increased work of breathing. Heart: Normal rate and rhythm. Normal S1  and S2. No gallop, click, or rub. No murmur. Abdomen: Bowel sounds normal; abdomen soft and nontender. No masses, organomegaly or hernias noted. Genitalia: Genitalia normal                        Musculoskeletal/extremities: No deformity or scoliosis noted of  the thoracic or lumbar spine.  No clubbing, cyanosis, edema, or significant extremity  deformity noted. Range of motion normal .Tone & strength normal. Hand joints normal  Fingernail  health good. No onycholysis Able to lie down & sit up w/o help. Negative SLR bilaterally Vascular: Carotid, radial artery, dorsalis pedis and  posterior tibial pulses are full and equal. No bruits present. Neurologic: Alert and oriented x3. Deep tendon reflexes symmetrical and normal.  Gait normal   Skin: Intact without suspicious lesions or rashes. Lymph: No cervical, axillary, or inguinal lymphadenopathy present. Psych: Mood and affect are normal. Normally interactive .  The mood disorder questionnaire was completed. He answered 11 of 13 questions as positive. Several of these were noted as occurring @ the same time. Despite this he describes these  as "no problem". There is no family history of manic depressive or bipolar disorder.  Assessment & Plan:  #1 muscle weakness  #2 fatigue  #3 erectile dysfunction  #4 mood disorder, undiagnosed. Suboptimal response to Cymbalta  Plan: See orders. I have recommended consultation with a neuropsychiatrist for optimal therapeutic assessment.  He has agreed to complete the 24 urine for catecholamines and metanephrines.

## 2014-02-23 ENCOUNTER — Other Ambulatory Visit: Payer: Self-pay | Admitting: *Deleted

## 2014-02-23 ENCOUNTER — Telehealth: Payer: Self-pay | Admitting: *Deleted

## 2014-02-23 DIAGNOSIS — N529 Male erectile dysfunction, unspecified: Secondary | ICD-10-CM

## 2014-02-23 MED ORDER — SILDENAFIL CITRATE 100 MG PO TABS: ORAL_TABLET | ORAL | Status: DC

## 2014-02-23 MED ORDER — SILDENAFIL CITRATE 20 MG PO TABS: ORAL_TABLET | ORAL | Status: DC

## 2014-02-23 NOTE — Telephone Encounter (Signed)
Wrong viagra was printed...Jermaine Kidd

## 2014-02-23 NOTE — Telephone Encounter (Signed)
Message copied by Earnstine Regal on Fri Feb 23, 2014  2:15 PM ------      Message from: Hendricks Limes      Created: Fri Feb 23, 2014  5:26 AM       Please print R for generic Viagra for him to pick up Mon am after labs. Thanks ------

## 2014-02-23 NOTE — Telephone Encounter (Signed)
Faxed md request to dr. Casimiro Needle @ [phone]...Jermaine Kidd

## 2014-02-23 NOTE — Patient Instructions (Signed)
Fasting am labs Monday 02/26/14.

## 2014-02-23 NOTE — Telephone Encounter (Signed)
Printed rx md sign left up front for pick-up...Jermaine Kidd

## 2014-02-23 NOTE — Telephone Encounter (Signed)
Message copied by Earnstine Regal on Fri Feb 23, 2014  2:11 PM ------      Message from: Hendricks Limes      Created: Fri Feb 23, 2014  5:27 AM       Please FAX office visit & lab results (TSH pending) to DrPlovsky,Psych Thanks ------

## 2014-02-26 ENCOUNTER — Other Ambulatory Visit (INDEPENDENT_AMBULATORY_CARE_PROVIDER_SITE_OTHER): Payer: 59

## 2014-02-26 DIAGNOSIS — R5383 Other fatigue: Secondary | ICD-10-CM

## 2014-02-26 DIAGNOSIS — N529 Male erectile dysfunction, unspecified: Secondary | ICD-10-CM

## 2014-02-26 DIAGNOSIS — M6281 Muscle weakness (generalized): Secondary | ICD-10-CM

## 2014-02-26 DIAGNOSIS — R5381 Other malaise: Secondary | ICD-10-CM

## 2014-02-26 LAB — HEPATIC FUNCTION PANEL
ALBUMIN: 4.1 g/dL (ref 3.5–5.2)
ALT: 42 U/L (ref 0–53)
AST: 28 U/L (ref 0–37)
Alkaline Phosphatase: 54 U/L (ref 39–117)
Bilirubin, Direct: 0.1 mg/dL (ref 0.0–0.3)
TOTAL PROTEIN: 7.4 g/dL (ref 6.0–8.3)
Total Bilirubin: 0.9 mg/dL (ref 0.2–1.2)

## 2014-02-26 LAB — CBC WITH DIFFERENTIAL/PLATELET
BASOS ABS: 0 10*3/uL (ref 0.0–0.1)
BASOS PCT: 0.3 % (ref 0.0–3.0)
EOS ABS: 0.1 10*3/uL (ref 0.0–0.7)
Eosinophils Relative: 1.1 % (ref 0.0–5.0)
HCT: 46.7 % (ref 39.0–52.0)
HEMOGLOBIN: 15.9 g/dL (ref 13.0–17.0)
Lymphocytes Relative: 23.2 % (ref 12.0–46.0)
Lymphs Abs: 1.3 10*3/uL (ref 0.7–4.0)
MCHC: 34 g/dL (ref 30.0–36.0)
MCV: 92.1 fl (ref 78.0–100.0)
MONO ABS: 0.5 10*3/uL (ref 0.1–1.0)
Monocytes Relative: 8.6 % (ref 3.0–12.0)
NEUTROS ABS: 3.8 10*3/uL (ref 1.4–7.7)
Neutrophils Relative %: 66.8 % (ref 43.0–77.0)
Platelets: 247 10*3/uL (ref 150.0–400.0)
RBC: 5.08 Mil/uL (ref 4.22–5.81)
RDW: 12.7 % (ref 11.5–15.5)
WBC: 5.7 10*3/uL (ref 4.0–10.5)

## 2014-02-26 LAB — BASIC METABOLIC PANEL
BUN: 16 mg/dL (ref 6–23)
CALCIUM: 9.4 mg/dL (ref 8.4–10.5)
CHLORIDE: 105 meq/L (ref 96–112)
CO2: 29 meq/L (ref 19–32)
CREATININE: 1.2 mg/dL (ref 0.4–1.5)
GFR: 69.67 mL/min (ref >60.00)
GLUCOSE: 101 mg/dL — AB (ref 70–99)
Potassium: 4.7 mEq/L (ref 3.5–5.1)
SODIUM: 140 meq/L (ref 135–145)

## 2014-02-26 LAB — TESTOSTERONE: TESTOSTERONE: 302.69 ng/dL (ref 300.00–890.00)

## 2014-02-26 LAB — CK: CK TOTAL: 246 U/L — AB (ref 7–232)

## 2014-02-27 ENCOUNTER — Telehealth: Payer: Self-pay

## 2014-02-27 ENCOUNTER — Ambulatory Visit: Payer: 59

## 2014-02-27 DIAGNOSIS — R7309 Other abnormal glucose: Secondary | ICD-10-CM

## 2014-02-27 LAB — CATECHOLAMINES, FRACTIONATED, URINE, 24 HOUR

## 2014-02-27 LAB — HEMOGLOBIN A1C: HEMOGLOBIN A1C: 6.1 % (ref 4.6–6.5)

## 2014-02-27 NOTE — Telephone Encounter (Signed)
Message copied by Shelly Coss on Tue Feb 27, 2014  8:16 AM ------      Message from: Hendricks Limes      Created: Mon Feb 26, 2014  7:02 PM       Please add A1c (790.29)       ------

## 2014-02-27 NOTE — Telephone Encounter (Signed)
Request for add on lab has been faxed  

## 2014-03-01 ENCOUNTER — Encounter: Payer: Self-pay | Admitting: Internal Medicine

## 2014-03-01 ENCOUNTER — Other Ambulatory Visit: Payer: Self-pay | Admitting: Internal Medicine

## 2014-03-01 DIAGNOSIS — R7989 Other specified abnormal findings of blood chemistry: Secondary | ICD-10-CM | POA: Insufficient documentation

## 2014-03-12 ENCOUNTER — Encounter: Payer: Self-pay | Admitting: Internal Medicine

## 2014-03-13 ENCOUNTER — Encounter: Payer: Self-pay | Admitting: Internal Medicine

## 2014-03-13 NOTE — Telephone Encounter (Signed)
Message copied by Izola Price on Tue Mar 13, 2014  3:22 PM ------      Message from: Hendricks Limes      Created: Mon Mar 12, 2014  6:41 PM       Please ask the lab to obtain the results of the 24-hour urine catecholamines and metanephrines which were collected 3 weeks ago. Thank you ------

## 2014-03-13 NOTE — Telephone Encounter (Signed)
Spoke with lab and they are faxing results over

## 2014-03-22 ENCOUNTER — Encounter: Payer: Self-pay | Admitting: Internal Medicine

## 2014-03-28 ENCOUNTER — Other Ambulatory Visit: Payer: Self-pay | Admitting: Internal Medicine

## 2014-03-28 ENCOUNTER — Encounter: Payer: Self-pay | Admitting: Internal Medicine

## 2014-03-28 ENCOUNTER — Telehealth: Payer: Self-pay | Admitting: Internal Medicine

## 2014-03-28 DIAGNOSIS — N529 Male erectile dysfunction, unspecified: Secondary | ICD-10-CM

## 2014-03-28 MED ORDER — LORAZEPAM 0.5 MG PO TABS: 0.5000 mg | ORAL_TABLET | Freq: Three times a day (TID) | ORAL | Status: DC | PRN

## 2014-03-28 MED ORDER — SILDENAFIL CITRATE 20 MG PO TABS: ORAL_TABLET | ORAL | Status: DC

## 2014-03-28 NOTE — Telephone Encounter (Signed)
Scripts for Revatio and Ativan have been faxed to [facility] on [address]

## 2014-03-29 NOTE — Telephone Encounter (Signed)
Prior authorization initiated for sildenafil citrate. Awaiting determination. JG//CMA

## 2014-04-02 ENCOUNTER — Telehealth: Payer: Self-pay

## 2014-04-02 NOTE — Telephone Encounter (Signed)
Patient has been advised that his insurance is not covering Sildenafil 20 mg. Per Dr Linna Darner he can pay out of pocket and [facility]. He mentioned another pharmacy he would like to try. He's going to check with that pharmacy to make sure they supply it and will call the office back of where he wants the script to go.

## 2014-05-09 ENCOUNTER — Other Ambulatory Visit: Payer: Self-pay | Admitting: Internal Medicine

## 2014-05-09 NOTE — Telephone Encounter (Signed)
OK X1 

## 2014-07-01 ENCOUNTER — Encounter: Payer: Self-pay | Admitting: Internal Medicine

## 2014-07-01 DIAGNOSIS — F319 Bipolar disorder, unspecified: Secondary | ICD-10-CM | POA: Insufficient documentation

## 2014-07-18 ENCOUNTER — Encounter: Payer: Self-pay | Admitting: Internal Medicine

## 2014-08-06 ENCOUNTER — Encounter: Payer: Self-pay | Admitting: Internal Medicine

## 2014-08-18 ENCOUNTER — Other Ambulatory Visit: Payer: Self-pay | Admitting: Internal Medicine

## 2014-08-20 NOTE — Telephone Encounter (Signed)
OK X1 

## 2014-10-17 ENCOUNTER — Other Ambulatory Visit: Payer: Self-pay | Admitting: Internal Medicine

## 2014-10-17 NOTE — Telephone Encounter (Signed)
OK X1, R X 2

## 2014-11-01 ENCOUNTER — Encounter: Payer: Self-pay | Admitting: Internal Medicine

## 2014-11-01 ENCOUNTER — Ambulatory Visit (INDEPENDENT_AMBULATORY_CARE_PROVIDER_SITE_OTHER): Payer: 59 | Admitting: Internal Medicine

## 2014-11-01 VITALS — BP 132/74 | HR 89 | Temp 97.9°F | Resp 14 | Wt 183.8 lb

## 2014-11-01 DIAGNOSIS — G479 Sleep disorder, unspecified: Secondary | ICD-10-CM

## 2014-11-01 DIAGNOSIS — F3170 Bipolar disorder, currently in remission, most recent episode unspecified: Secondary | ICD-10-CM

## 2014-11-01 DIAGNOSIS — N528 Other male erectile dysfunction: Secondary | ICD-10-CM

## 2014-11-01 DIAGNOSIS — Z8489 Family history of other specified conditions: Secondary | ICD-10-CM

## 2014-11-01 DIAGNOSIS — Z87898 Personal history of other specified conditions: Secondary | ICD-10-CM

## 2014-11-01 MED ORDER — SILDENAFIL CITRATE 20 MG PO TABS: ORAL_TABLET | ORAL | Status: DC

## 2014-11-01 NOTE — Progress Notes (Signed)
   Subjective:    Patient ID: Jermaine Kidd, male    DOB: 1966-04-13, 49 y.o.   MRN: [id number]  HPI  He is here concerned about sleep dysfunction. He's been taking zolpidem 10 mg 3-4 times a week without benefit . This was prescribed by Dr. Toy Cookey, Psychiatrist.    He does drink 3-4 caffeinated diet drinks per day. He was previously on a stimulant containing caffeine which he's discontinued.  He does read in bed.  He's physically active at the gym with weights & cardiovascular exercise 5 days a week without cardiopulmonary symptoms  His wife describes occasional snoring as well as possible apnea.  He is being treated for bipolar disorder and takes clonazepam or lorazepam as needed. He's on fluoxamine 100 milligrams daily. He feels a full pill causes metallic taste in his mouth. His reduced it to a fourth of a pill with resolution of symptoms. His also is on Eszopiclone 2 mg one half-1 at bedtime.  He was on Seroquel but he said that he "lost days" on this  ED symptoms persist;he  is interested in pursuing the generic Viagra compound.  Review of Systems  He denies recent panic attacks. These have been manifested as if his chest were "pressing out" and being fearful.   Chest pain, palpitations, tachycardia, exertional dyspnea, paroxysmal nocturnal dyspnea, claudication or edema are absent.      Objective:   Physical Exam  General appearance:good health ;well nourished; no acute distress or increased work of breathing is present.  No  lymphadenopathy about the head, neck, or axilla noted.   Eyes: No conjunctival inflammation or lid edema is present. There is no scleral icterus.Unsustained nystagmus with superior gaze;EOMI  Ears:  External ear exam shows no significant lesions or deformities.  Otoscopic examination reveals clear canals, tympanic membranes are intact bilaterally without bulging, retraction, inflammation or discharge.  Nose:  External nasal examination shows no  deformity or inflammation. Nasal mucosa are pink and moist without lesions or exudates. No septal dislocation or deviation.No obstruction to airflow.   Oral exam: Dental hygiene is good; lips and gums are healthy appearing.There is no oropharyngeal erythema or exudate noted.   Neck:  No deformities, thyromegaly, masses, or tenderness noted.   Supple with full range of motion without pain.   Heart:  Normal rate and regular rhythm. S1 and S2 normal without gallop, murmur, click, rub or other extra sounds.   Lungs:Chest clear to auscultation; no wheezes, rhonchi,rales ,or rubs present.No increased work of breathing.    Extremities:  No cyanosis, edema, or clubbing  noted    Skin: Warm & dry w/o jaundice or tenting.       Assessment & Plan:  #1 sleep dysfunction  #2 erectile dysfunction Plan: Sleep Medicine specialty referral. Sleep hygiene interventions.

## 2014-11-01 NOTE — Patient Instructions (Addendum)
To prevent sleep dysfunction follow these instructions for sleep hygiene. Do not read, watch TV, or eat in bed. Do not get into bed until you are ready to turn off the light &  to go to sleep. Do not ingest stimulants ( decongestants, diet pills, nicotine, caffeine) after the evening meal.Do not take daytime naps.Continue cardiovascular exercise recommended 30-45 minutes 3-4 times per week. Alternatively cheaper therapeutically equivalent option for Viagra is available from  [facility] at [phone].

## 2014-11-01 NOTE — Progress Notes (Signed)
Pre visit review using our clinic review tool, if applicable. No additional management support is needed unless otherwise documented below in the visit note. 

## 2014-11-28 ENCOUNTER — Institutional Professional Consult (permissible substitution): Payer: Self-pay | Admitting: Neurology

## 2014-12-12 ENCOUNTER — Ambulatory Visit (INDEPENDENT_AMBULATORY_CARE_PROVIDER_SITE_OTHER): Payer: 59 | Admitting: Neurology

## 2014-12-12 ENCOUNTER — Encounter: Payer: Self-pay | Admitting: Neurology

## 2014-12-12 VITALS — BP 133/78 | HR 85 | Resp 14 | Ht 69.25 in | Wt 185.0 lb

## 2014-12-12 DIAGNOSIS — F4321 Adjustment disorder with depressed mood: Secondary | ICD-10-CM

## 2014-12-12 DIAGNOSIS — F5105 Insomnia due to other mental disorder: Secondary | ICD-10-CM | POA: Diagnosis not present

## 2014-12-12 DIAGNOSIS — F5102 Adjustment insomnia: Secondary | ICD-10-CM | POA: Diagnosis not present

## 2014-12-12 DIAGNOSIS — F329 Major depressive disorder, single episode, unspecified: Secondary | ICD-10-CM | POA: Diagnosis not present

## 2014-12-12 DIAGNOSIS — F32A Depression, unspecified: Secondary | ICD-10-CM

## 2014-12-12 MED ORDER — SUVOREXANT 15 MG PO TABS: 20.0000 mg | ORAL_TABLET | Freq: Every evening | ORAL | Status: DC

## 2014-12-12 NOTE — Progress Notes (Signed)
SLEEP MEDICINE CLINIC   Provider:  Larey Seat, M D  Referring Provider: Hendricks Limes, MD Primary Care Physician:  Unice Cobble, MD  Chief Complaint  Patient presents with  . NP [facility] Sleep consult, can't sleep    Rm 10, alone    HPI:  Jermaine Kidd is a 49 y.o. male seen here as a referral  from Dr. Unice Cobble for Insomnia,     Jermaine Kidd, who prefers to go by "Jermaine Kidd", seen here today upon request of Dr. Unice Cobble. He reports that he had difficulties falling asleep and staying asleep until Lunesta was prescribed for him. Lunesta helped for a while it was prescribed from about 3 months with good effect at the time at 2 mg. He would sometimes take a half pill a full pill. Then the medication seemed to have no longer had the same effect and he switch to Ambien. Ambien was prescribed in the generic form so P damp at 10 mg tablets. Here again he experienced initially a better result than over several weeks. The effect was again waxing and waning and he decided to cut the Ambien pill in half. Remarkably 5 mg work better for him than 10 mg . The baby was a pharmacokinetic aspect to why this is working for him. He has never been using an extended release form however. He also has been given Klonopin,  becauses of anxiety , prescribed by Dr. Toy Cookey.  And he has been on Prozac and Ativan for the same reason- it was also prescribed for anxiety and he was allowed to take up to 2  Ativan a day but because of its potential habit-forming side effects the patient has not used it in a while.  Jermaine Kidd reports how frustrating it was to deal with his chronic insomnia. Sometimes he would take Lunesta or Ambien and still lay awake for 6 or 7 hours. He felt almost that it was a paradoxical effect; what kept  other people asleep may have kept him awake.  He reports no nocturnal palpitations, but he feels highly anxious. He describes not truly panic attacks but an anxiety underlying his  experiences of the day. Looking back Jermaine Kidd feels that throughout his life he didn't need much sleep. However that insomnia became an issue began around 2015. He remembers that especially the Christmastime 2015 was bad for him. Sometimes he would ruminate about , if he took too long in the shower, if he paid bills. He has no financial or job difficulties, he is happily remarried for 18 month .  He has a chest pressure, a palpable pressure on his chest.   The patient and his wife usually go to bed between 10:30 or around that time, he will take an Ambien at the time also tablet and usually does not engage in anything swelling or very exciting. He tries to wean down and get relaxed. It may still take a long time to fall asleep , though. Currently he will rarely falls asleep before 11:30. Once asleep he will stay asleep for about 1 hour, then will wake up but has trouble to reinitiate sleep for the rest of the night. Often he just decides to stay up.  He may get get as little as one or 2 hours of sleep at night and the last weeks has been very difficult. He was on a trip to [facility] , when he had again insomnia problem. End of Febr. 2016.  He does  not take daytimes naps in the last month, but used to nap, he never flet this detracted form his nocturnal sleep. '   His father has anxiety and insomnia, chronically . He is asleep whenever his son looks at him ,but insists he is not.   Review of Systems: Out of a complete 14 system review, the patient complains of only the following symptoms, and all other reviewed systems are negative. insomnia, chronic.   Epworth score  12 , Fatigue severity score 38   , depression score 6 " always Know this will be a lousy day "    History   Social History  . Marital Status: Married    Spouse Name: [name]  . Number of Children: 4  . Years of Education: college   Occupational History  . Sales    Social History Main Topics  . Smoking status: Never Smoker     . Smokeless tobacco: Not on file  . Alcohol Use: No  . Drug Use: No  . Sexual Activity: Not on file   Other Topics Concern  . Not on file   Social History Narrative    Family History  Problem Relation Age of Onset  . Alzheimer's disease Paternal Grandmother   . Alzheimer's disease Maternal Aunt   . Other Father     Knee problems    Past Medical History  Diagnosis Date  . Anxiety disorder     onset age34    Past Surgical History  Procedure Laterality Date  . Shoulder surgery    . Facial reconstruction surgery      baseball injury    Current Outpatient Prescriptions  Medication Sig Dispense Refill  . clonazePAM (KLONOPIN) 1 MG tablet Take 1 mg by mouth 2 (two) times daily. Takes 0.5mg  prn    . FLUoxetine (PROZAC) 10 MG tablet     . sildenafil (REVATIO) 20 MG tablet 3-5 qd prn 90 tablet 0  . zolpidem (AMBIEN) 10 MG tablet Take 10 mg by mouth at bedtime as needed for sleep.     No current facility-administered medications for this visit.    Allergies as of 12/12/2014 - Review Complete 12/12/2014  Allergen Reaction Noted  . Cymbalta [duloxetine hcl]  07/01/2014    Vitals: BP 133/78 mmHg  Pulse 85  Resp 14  Ht 5' 9.25" (1.759 m)  Wt 185 lb (83.915 kg)  BMI 27.12 kg/m2 Last Weight:  Wt Readings from Last 1 Encounters:  12/12/14 185 lb (83.915 kg)       Last Height:   Ht Readings from Last 1 Encounters:  12/12/14 5' 9.25" (1.759 m)    Physical exam:  General: The patient is awake, alert and appears not in acute distress. The patient is well groomed. Head: Normocephalic, atraumatic. Neck is supple. Mallampati 1,  neck circumference:16  Nasal airflow restricited  TMJ is  evident . Retrognathia is seen.  Dry mouth.  Cardiovascular:  Regular rate and rhythm, without  murmurs or carotid bruit, and without distended neck veins. Respiratory: Lungs are clear to auscultation. Skin:  Without evidence of edema, or rash Trunk: BMI is normal  and patient  has normal  posture.  Neurologic exam : The patient is awake and alert, oriented to place and time.   Memory subjective described as intact. here is a normal attention span & concentration ability. Speech is fluent without dysarthria, dysphonia or aphasia.  Mood and affect are e anxious , he keeps his arms crossed in front of his chest,  chews bubble gum during the interview.  Cranial nerves: Pupils are equal and briskly reactive to light. Funduscopic exam without  evidence of pallor or edema. Extraocular movements  in vertical and horizontal planes intact and without nystagmus. Visual fields by finger perimetry are intact. Hearing to finger rub intact.  Facial sensation intact to fine touch. Facial motor strength is symmetric and tongue and uvula move midline.  Motor exam:   Normal tone ,muscle bulk and symmetric ,strength in all extremities.  Sensory:  Fine touch, pinprick and vibration were tested in all extremities. Proprioception is normal.  Coordination: Rapid alternating movements in the fingers/hands is normal. Finger-to-nose maneuver  normal without evidence of ataxia, dysmetria or tremor.  Gait and station: Patient walks without assistive device and is able unassisted to climb up to the exam table.  Strength within normal limits. Stance is stable and normal. Tandem gait is unfragmented. Romberg testing is negative.  Deep tendon reflexes: in the  upper and lower extremities are brisk  symmetric and intact. Babinski maneuver response is  downgoing.   Assessment:  After physical and neurologic examination, review of laboratory studies, imaging, neurophysiology testing and pre-existing records, assessment is  1) Jermaine Kidd describes a chronic pattern of insomnia and a family history of insomnia. This may also be a sleep perception problem. I would like to obtain an attended sleep study that we can be sure how much time the patient truly spends asleep at night and in which sleep stages. Just 14  days ago he started on Prozac and he probably will need another 14 days on the medication to have the full effect. Initially he experienced some vivid dreams some of them almost nightmarish. 2) I will order a PSG with a regular montage. We do not need CO2. I would like to advise the technologist at night that we should split at an AHI of 15 should any apnea be seen. Since the patient has retrognathia and a slightly enlarged neck size but truly does not have an upper airway restriction visible I'm not sure that we don't just find snoring without apnea. If that is the case we will treat her snoring as is. He is allowed to bring his sleep aids I will actually prescribe L Sumrall for him and give him samples today. Belsomra is not habit forming and does not suppress respiratory drive.     The patient was advised of the nature of the diagnosed sleep disorder ,  Insomnia the treatment options and risks for general a health and wellness arising from not treating the condition. Visit duration was 35  minutes.  505 of the face to face time spent in  discussion and education.   Plan:  Treatment plan and additional workup :  PSG, split no CO2.  Split AHI 20 and score at 4 % . belsomra samples.       Asencion Partridge Nguyet Mercer MD  12/12/2014

## 2014-12-12 NOTE — Patient Instructions (Signed)
Insomnia Insomnia is frequent trouble falling and/or staying asleep. Insomnia can be a long term problem or a short term problem. Both are common. Insomnia can be a short term problem when the wakefulness is related to a certain stress or worry. Long term insomnia is often related to ongoing stress during waking hours and/or poor sleeping habits. Overtime, sleep deprivation itself can make the problem worse. Every little thing feels more severe because you are overtired and your ability to cope is decreased. CAUSES   Stress, anxiety, and depression.  Poor sleeping habits.  Distractions such as TV in the bedroom.  Naps close to bedtime.  Engaging in emotionally charged conversations before bed.  Technical reading before sleep.  Alcohol and other sedatives. They may make the problem worse. They can hurt normal sleep patterns and normal dream activity.  Stimulants such as caffeine for several hours prior to bedtime.  Pain syndromes and shortness of breath can cause insomnia.  Exercise late at night.  Changing time zones may cause sleeping problems (jet lag). It is sometimes helpful to have someone observe your sleeping patterns. They should look for periods of not breathing during the night (sleep apnea). They should also look to see how long those periods last. If you live alone or observers are uncertain, you can also be observed at a sleep clinic where your sleep patterns will be professionally monitored. Sleep apnea requires a checkup and treatment. Give your caregivers your medical history. Give your caregivers observations your family has made about your sleep.  SYMPTOMS   Not feeling rested in the morning.  Anxiety and restlessness at bedtime.  Difficulty falling and staying asleep. TREATMENT   Your caregiver may prescribe treatment for an underlying medical disorders. Your caregiver can give advice or help if you are using alcohol or other drugs for self-medication. Treatment  of underlying problems will usually eliminate insomnia problems.  Medications can be prescribed for short time use. They are generally not recommended for lengthy use.  Over-the-counter sleep medicines are not recommended for lengthy use. They can be habit forming.  You can promote easier sleeping by making lifestyle changes such as:  Using relaxation techniques that help with breathing and reduce muscle tension.  Exercising earlier in the day.  Changing your diet and the time of your last meal. No night time snacks.  Establish a regular time to go to bed.  Counseling can help with stressful problems and worry.  Soothing music and white noise may be helpful if there are background noises you cannot remove.  Stop tedious detailed work at least one hour before bedtime. HOME CARE INSTRUCTIONS   Keep a diary. Inform your caregiver about your progress. This includes any medication side effects. See your caregiver regularly. Take note of:  Times when you are asleep.  Times when you are awake during the night.  The quality of your sleep.  How you feel the next day. This information will help your caregiver care for you.  Get out of bed if you are still awake after 15 minutes. Read or do some quiet activity. Keep the lights down. Wait until you feel sleepy and go back to bed.  Keep regular sleeping and waking hours. Avoid naps.  Exercise regularly.  Avoid distractions at bedtime. Distractions include watching television or engaging in any intense or detailed activity like attempting to balance the household checkbook.  Develop a bedtime ritual. Keep a familiar routine of bathing, brushing your teeth, climbing into bed at the same   time each night, listening to soothing music. Routines increase the success of falling to sleep faster.  Use relaxation techniques. This can be using breathing and muscle tension release routines. It can also include visualizing peaceful scenes. You can  also help control troubling or intruding thoughts by keeping your mind occupied with boring or repetitive thoughts like the old concept of counting sheep. You can make it more creative like imagining planting one beautiful flower after another in your backyard garden.  During your day, work to eliminate stress. When this is not possible use some of the previous suggestions to help reduce the anxiety that accompanies stressful situations. MAKE SURE YOU:   Understand these instructions.  Will watch your condition.  Will get help right away if you are not doing well or get worse. Document Released: 09/25/2000 Document Revised: 12/21/2011 Document Reviewed: 10/26/2007 ExitCare Patient Information 2015 [employer]. This information is not intended to replace advice given to you by your health care provider. Make sure you discuss any questions you have with your health care provider.  

## 2015-01-08 ENCOUNTER — Other Ambulatory Visit (INDEPENDENT_AMBULATORY_CARE_PROVIDER_SITE_OTHER): Payer: 59

## 2015-01-08 ENCOUNTER — Ambulatory Visit (INDEPENDENT_AMBULATORY_CARE_PROVIDER_SITE_OTHER): Payer: 59 | Admitting: Internal Medicine

## 2015-01-08 ENCOUNTER — Encounter: Payer: Self-pay | Admitting: Internal Medicine

## 2015-01-08 ENCOUNTER — Telehealth: Payer: Self-pay

## 2015-01-08 VITALS — BP 142/88 | HR 77 | Temp 98.0°F | Resp 14 | Ht 69.0 in | Wt 180.0 lb

## 2015-01-08 DIAGNOSIS — F4322 Adjustment disorder with anxiety: Secondary | ICD-10-CM

## 2015-01-08 DIAGNOSIS — R739 Hyperglycemia, unspecified: Secondary | ICD-10-CM | POA: Insufficient documentation

## 2015-01-08 DIAGNOSIS — G479 Sleep disorder, unspecified: Secondary | ICD-10-CM | POA: Diagnosis not present

## 2015-01-08 DIAGNOSIS — R7303 Prediabetes: Secondary | ICD-10-CM | POA: Insufficient documentation

## 2015-01-08 LAB — TSH: TSH: 0.83 u[IU]/mL (ref 0.35–4.50)

## 2015-01-08 LAB — T4, FREE: Free T4: 0.93 ng/dL (ref 0.60–1.60)

## 2015-01-08 LAB — T3, FREE: T3, Free: 2.6 pg/mL (ref 2.3–4.2)

## 2015-01-08 NOTE — Telephone Encounter (Signed)
Faxed to [phone]

## 2015-01-08 NOTE — Telephone Encounter (Signed)
-----   Message from Hendricks Limes, MD sent at 01/08/2015  2:47 PM EDT ----- Please FAX office visit & 5/15  lab results to Dr Launa Flight, Psych

## 2015-01-08 NOTE — Progress Notes (Signed)
Pre visit review using our clinic review tool, if applicable. No additional management support is needed unless otherwise documented below in the visit note. 

## 2015-01-08 NOTE — Patient Instructions (Addendum)
    I'll check with dr Beacher May, the sleep specialist, to see if additional studies were to be completed but not scheduled.   Your next office appointment will be determined based upon review of your pending labs .  Those instructions will be transmitted to you by My Chart   Critical results will be called.   Followup as needed for any active or acute issue. Please report any significant change in your symptoms.

## 2015-01-08 NOTE — Progress Notes (Signed)
Subjective:    Patient ID: Jermaine Kidd, male    DOB: Sep 18, 1966, 49 y.o.   MRN: [id number]  HPI He  Initially was scheduled for a physical ; but once here he stated he just wanted discuss to various issues.  He is now seeing Dr. Launa Flight. He continues to be frustrated with his anxiety disorder.He can awaken with anxiety. Presently he is on clonazepam 1 mg up to twice a day which is effective. He does not feel the Prozac is of benefit and may have affected his appetite with associated weight loss. He did actually gain weight on Paxil. He has intolerances to multiple other neurotransmitter agents.    He had been seen at [facility] and diagnosed with possible bipolar disorder abscessogram also disorder. He stated he never saw a physician but only the PA and that the diagnosis was based on screening test rather than on in-depth interviews.    He does eat fried foods 2-3 times per week but only eats red meat once a week.  He works out at [employer] 5 days a week. 2 days he does 25 minutes of cardiovascular and 3 days ago 60 minutes and weights. He does feel anxious after employing the weights. x  He has no associated cardiopulmonary symptoms   He denies a constellation of headache, flushing, chest pain, & diarrhea. The 24 hr urine for catecholamines and metanephrines in 02/2014 were normal.   Dr Dohmier recommended Belsomra but it was not effective.Sleep study was never completed as planned.  Review of Systems  Decrease in appetite with Prozac with 5# weight change. No blurred, double ,loss of vision No palpitations; racing; irregularity No constipation; diarrhea;hoarseness. No change in nails,skin or skin Some numbness in hands when supine;no tingling& no tremor No depression; panic attacks.Some irritabilty No temperature intolerance to heat ,cold      Objective:   Physical Exam Gen.: Adequately nourished in appearance. Alert, appropriate and cooperative throughout exam.    Appears younger than stated age  Head: Normocephalic without obvious abnormalities; pattern alopecia  Eyes: No corneal or conjunctival inflammation noted. Pupils equal round reactive to light and accommodation. Extraocular motion intact. No nystagmus or lid lag Ears: External  ear exam reveals no significant lesions or deformities. Canals clear .TMs normal. Hearing is grossly normal bilaterally. Nose: External nasal exam reveals no deformity or inflammation. Nasal mucosa are pink and moist. No lesions or exudates noted.   Mouth: Oral mucosa and oropharynx reveal no lesions or exudates. Teeth in good repair. Neck: No deformities, masses, or tenderness noted. Range of motion &. Thyroid normal Lungs: Normal respiratory effort; chest expands symmetrically. Lungs are clear to auscultation without rales, wheezes, or increased work of breathing. Heart: Normal rate and rhythm. Normal S1 and S2. No gallop, click, or rub. No murmur. Abdomen: Bowel sounds normal; abdomen soft and nontender. No masses, organomegaly or hernias noted. Musculoskeletal/extremities: No deformity or scoliosis noted of  the thoracic or lumbar spine.  No clubbing, cyanosis, edema, or significant extremity  deformity noted.  Range of motion normal . Tone & strength normal. Hand joints reveal minor flexion 5th L finger. Fingernail  health good. Minor crepitus of knees  Able to lie down & sit up w/o help.  Negative SLR bilaterally Vascular: Carotid, radial artery, dorsalis pedis and  posterior tibial pulses are full and equal. No bruits present. Neurologic: Alert and oriented x3. Deep tendon reflexes symmetrical and normal. No tremor Gait normal  Skin: Intact without suspicious lesions or rashes. Lymph: No cervical, axillary lymphadenopathy present. Psych: Mood and affect are normal. Normally interactive                                                                                       Assessment & Plan:  #1  anxiety disorder #2 sleep disorder See Orders

## 2015-04-24 ENCOUNTER — Other Ambulatory Visit (HOSPITAL_COMMUNITY): Payer: Self-pay | Admitting: Psychiatry

## 2015-08-19 ENCOUNTER — Telehealth: Payer: Self-pay | Admitting: Internal Medicine

## 2015-08-19 DIAGNOSIS — N528 Other male erectile dysfunction: Secondary | ICD-10-CM

## 2015-08-19 NOTE — Telephone Encounter (Signed)
Pt requesting refill for sildenafil (REVATIO) 20 MG tablet [[id number] Pharmacy is [facility] on [address] in [place]

## 2015-08-20 MED ORDER — SILDENAFIL CITRATE 20 MG PO TABS: ORAL_TABLET | ORAL | Status: DC

## 2015-08-20 NOTE — Telephone Encounter (Signed)
Notified pt refill has been sent, but need to estab w/new provider for additional refills...Jermaine Kidd

## 2016-06-23 ENCOUNTER — Encounter: Payer: Self-pay | Admitting: Family

## 2016-06-23 ENCOUNTER — Ambulatory Visit (INDEPENDENT_AMBULATORY_CARE_PROVIDER_SITE_OTHER): Payer: 59 | Admitting: Family

## 2016-06-23 VITALS — BP 126/82 | HR 88 | Temp 98.2°F | Resp 16 | Ht 69.0 in | Wt 176.0 lb

## 2016-06-23 DIAGNOSIS — N529 Male erectile dysfunction, unspecified: Secondary | ICD-10-CM | POA: Insufficient documentation

## 2016-06-23 DIAGNOSIS — Z23 Encounter for immunization: Secondary | ICD-10-CM | POA: Diagnosis not present

## 2016-06-23 DIAGNOSIS — N528 Other male erectile dysfunction: Secondary | ICD-10-CM

## 2016-06-23 DIAGNOSIS — F418 Other specified anxiety disorders: Secondary | ICD-10-CM

## 2016-06-23 MED ORDER — SILDENAFIL CITRATE 20 MG PO TABS: 20.0000 mg | ORAL_TABLET | Freq: Every day | ORAL | 0 refills | Status: DC | PRN

## 2016-06-23 NOTE — Progress Notes (Signed)
Subjective:    Patient ID: Jermaine Kidd, male    DOB: May 28, 1966, 50 y.o.   MRN: [id number]  Chief Complaint  Patient presents with  . Establish Care    medication refill    HPI:  Jermaine Kidd is a 50 y.o. male who  has a past medical history of Anxiety disorder. and presents today for a follow up office visit.   1.) Anxiety - Currently maintained on clonazepam. Reports taking the medication as prescribed and denies adverse side effects. Notes symptoms are generally adequate control with the medication regimen.   2.) Erectile dysfunction - currently maintained on sildenafil. Reports taking medication as prescribed and denies adverse side effects. Notes his symptoms are generally well controlled with the medication.    Allergies  Allergen Reactions  . Cymbalta [Duloxetine Hcl]     Hypomanic sx on 60 mg ; dose decreased to 30 mg by Dr Barbie Banner      Outpatient Medications Prior to Visit  Medication Sig Dispense Refill  . clonazePAM (KLONOPIN) 1 MG tablet Take 1 mg by mouth 2 (two) times daily. Takes 0.5mg  prn    . sildenafil (REVATIO) 20 MG tablet 3-5 qd prn 90 tablet 0  . FLUoxetine (PROZAC) 10 MG tablet     . zolpidem (AMBIEN) 10 MG tablet Take 10 mg by mouth at bedtime as needed for sleep.     No facility-administered medications prior to visit.     Review of Systems  Constitutional: Negative for chills and fever.  Respiratory: Negative for chest tightness and shortness of breath.   Cardiovascular: Negative for chest pain, palpitations and leg swelling.      Objective:    BP 126/82 (BP Location: Left Arm, Patient Position: Sitting, Cuff Size: Normal)   Pulse 88   Temp 98.2 F (36.8 C) (Oral)   Resp 16   Ht 5\' 9"  (1.753 m)   Wt 176 lb (79.8 kg)   SpO2 97%   BMI 25.99 kg/m  Nursing note and vital signs reviewed.  Physical Exam  Constitutional: He is oriented to person, place, and time. He appears well-developed and well-nourished. No distress.    Cardiovascular: Normal rate, regular rhythm, normal heart sounds and intact distal pulses.   Pulmonary/Chest: Effort normal and breath sounds normal.  Neurological: He is alert and oriented to person, place, and time.  Skin: Skin is warm and dry.  Psychiatric: He has a normal mood and affect. His behavior is normal. Judgment and thought content normal.       Assessment & Plan:   Problem List Items Addressed This Visit      Genitourinary   Erectile dysfunction - Primary    Symptoms of erectile dysfunction appear adequately controlled with current medication and no adverse side effects. Continue current dosage of sildenafil.      Relevant Medications   sildenafil (REVATIO) 20 MG tablet     Other   Anxiety disorder    Anxiety appears adequately controlled with current regimen and managed by psychiatry. Continue current dosage of clonazepam with changes per psychiatry.       Other Visit Diagnoses    Encounter for immunization       Relevant Orders   Flu Vaccine QUAD 36+ mos IM (Completed)       I have discontinued Mr. Risby's zolpidem and FLUoxetine. I have also changed his sildenafil. Additionally, I am having him maintain his clonazePAM.   Meds ordered this encounter  Medications  . sildenafil (REVATIO)  20 MG tablet    Sig: Take 1-5 tablets (20-100 mg total) by mouth daily as needed.    Dispense:  50 tablet    Refill:  0    Order Specific Question:   Supervising Provider    Answer:   Pricilla Holm A [id number]     Follow-up: Return if symptoms worsen or fail to improve.  Mauricio Po, FNP

## 2016-06-23 NOTE — Patient Instructions (Signed)
Thank you for choosing [facility].  SUMMARY AND INSTRUCTIONS:  Medication:  Please continue take your medications as prescribed.   Your prescription(s) have been submitted to your pharmacy or been printed and provided for you. Please take as directed and contact our office if you believe you are having problem(s) with the medication(s) or have any questions.   Follow up:  If your symptoms worsen or fail to improve, please contact our office for further instruction, or in case of emergency go directly to the emergency room at the closest medical facility.

## 2016-06-23 NOTE — Assessment & Plan Note (Signed)
Symptoms of erectile dysfunction appear adequately controlled with current medication and no adverse side effects. Continue current dosage of sildenafil.

## 2016-06-23 NOTE — Assessment & Plan Note (Signed)
Anxiety appears adequately controlled with current regimen and managed by psychiatry. Continue current dosage of clonazepam with changes per psychiatry.

## 2017-04-02 ENCOUNTER — Other Ambulatory Visit (HOSPITAL_COMMUNITY): Payer: Self-pay

## 2017-04-02 ENCOUNTER — Ambulatory Visit (INDEPENDENT_AMBULATORY_CARE_PROVIDER_SITE_OTHER): Payer: 59 | Admitting: Psychiatry

## 2017-04-02 ENCOUNTER — Encounter (HOSPITAL_COMMUNITY): Payer: Self-pay | Admitting: Psychiatry

## 2017-04-02 VITALS — BP 122/74 | HR 69 | Ht 68.5 in | Wt 164.2 lb

## 2017-04-02 DIAGNOSIS — Z79899 Other long term (current) drug therapy: Secondary | ICD-10-CM | POA: Diagnosis not present

## 2017-04-02 DIAGNOSIS — Z810 Family history of intellectual disabilities: Secondary | ICD-10-CM

## 2017-04-02 DIAGNOSIS — F3130 Bipolar disorder, current episode depressed, mild or moderate severity, unspecified: Secondary | ICD-10-CM

## 2017-04-02 DIAGNOSIS — F419 Anxiety disorder, unspecified: Secondary | ICD-10-CM

## 2017-04-02 MED ORDER — VENLAFAXINE HCL ER 75 MG PO CP24: 75.0000 mg | ORAL_CAPSULE | Freq: Every day | ORAL | 1 refills | Status: DC

## 2017-04-02 MED ORDER — LAMOTRIGINE 25 MG PO TABS: ORAL_TABLET | ORAL | 1 refills | Status: DC

## 2017-04-02 NOTE — Progress Notes (Signed)
Psychiatric Initial Adult Assessment   Patient Identification: Jermaine Kidd MRN:  [id number] Date of Evaluation:  04/02/2017 Referral Source: I want to get second opinion.  I'm not happy with my current psychiatrist.  Chief Complaint:   Visit Diagnosis:    ICD-10-CM   1. Encounter for long-term (current) use of medications Z79.899 CBC with Differential/Platelet    COMPLETE METABOLIC PANEL WITH GFR    Hemoglobin A1c    TSH    Vitamin D 1,25 dihydroxy    Testosterone  2. Bipolar I disorder, most recent episode depressed (HCC) F31.30 venlafaxine XR (EFFEXOR XR) 75 MG 24 hr capsule    lamoTRIgine (LAMICTAL) 25 MG tablet    DISCONTINUED: lamoTRIgine (LAMICTAL) 25 MG tablet    DISCONTINUED: venlafaxine XR (EFFEXOR XR) 75 MG 24 hr capsule    History of Present Illness:  Patient is 51 year old Caucasian, employed, married man who is self-referred seeking second opinion as he is not happy with his current psychiatrist.  He is seeing Dr. Launa Flight for past 3 years.  He is taking Effexor, Klonopin but believe his medicine is not working.  He continues to feel anxious, nervous, irritable and have no energy, motivation, sadness and no desire to do things.  He came with his wife.  In the beginning patient did not trigger what causing the anxiety but later on his wife mentioned that currently they are separated 8 months ago because there was too much tension living together.  Patient married to his wife 4 years ago and he has 2 children from his previous marriage and her current wife has 2 children on her own.  Wife told that together is a lot of tension and decided to live separately.  Patient feels that he is so sad and sometime feeling crap during the day.  He does not specify any triggers at work but he admitted he worried about his future, finances and his well-being.  He stopped doing the things that he used to enjoy.  He does not leave the house and does not feel enjoyment in his life.  He endorse  easily fatigue, social isolation, lack of focus.  His wife endorsed that patient has irritability, mood swing, impulsive behavior.  Recently he impulsively bought a house and the need to sell it .  He is living in an apartment he does not like living in apartment.  In the past he was diagnosed with bipolar disorder but he does not believe he has manic symptoms but wife endorsed he has lot of mood swings and irritability.  In the past he had tried Prozac, Cymbalta, Paxil, Wellbutrin, Seroquel, Ambien , Lexapro but limited response.  Currently he is taking Klonopin prescribed 1 mg twice a day but he usually takes once a day.  He is also taking Effexor 150 mg.  He noticed some time the dose does not last all day and in the evening he gets very tired and fatigued.  He did not have any blood work in past 2 years.  Patient denies drinking alcohol or using any illegal substances.  He denies any paranoia, hallucination, psychosis, suicidal thoughts or homicidal thought.  Denies any self abusive behavior.  His energy level is low.  His vital signs are stable.  However he had gained 10 pounds in past 6 months because he has no appetite.  He has no tremors shakes or any EPS.  Associated Signs/Symptoms: Depression Symptoms:  depressed mood, anhedonia, fatigue, difficulty concentrating, hopelessness, anxiety, loss of energy/fatigue, (Hypo)  Manic Symptoms:  Impulsivity, Irritable Mood, Labiality of Mood, Anxiety Symptoms:  Excessive Worry, Psychotic Symptoms:  No psychotic symptoms. PTSD Symptoms: NA  Past Psychiatric History: Patient denies any history of psychiatric inpatient treatment or any suicidal attempt.  He has been taking psychiatric medication for past 6 years.  In the beginning medications were prescribed by his primary care physician.  He was also seeing psychiatrist at [facility] but he was not happy with the treatment.  He was seeing Dr. Launa Flight for past 3 years.  He had tried numerous  psychotropic medication including Prozac, Ambien, Cymbalta, Paxil, Wellbutrin, Seroquel, Lexapro, Klonopin.  He developed sexual side effects with most of antidepressant.  Patient endorse history of irritability, impulsive behavior, depression and anxiety.  Previous Psychotropic Medications: Yes   Substance Abuse History in the last 12 months:  No.  Consequences of Substance Abuse: Negative  Past Medical History:  Past Medical History:  Diagnosis Date  . Anxiety   . Anxiety disorder    onset age 60    Past Surgical History:  Procedure Laterality Date  . FACIAL RECONSTRUCTION SURGERY  2005   baseball injury  . SHOULDER SURGERY  2004    Family Psychiatric History: Patient reported father may have anxiety symptoms.  Family History:  Family History  Problem Relation Age of Onset  . Osteoarthritis Father   . Alzheimer's disease Paternal Grandmother   . Alzheimer's disease Maternal Aunt   . Heart disease Maternal Grandmother   . Diabetes Neg Hx   . Cancer Neg Hx   . Stroke Neg Hx     Social History:   Social History   Social History  . Marital status: Married    Spouse name: [name]  . Number of children: 4  . Years of education: college   Occupational History  . Sales    Social History Main Topics  . Smoking status: Never Smoker  . Smokeless tobacco: Current User    Types: Chew  . Alcohol use No  . Drug use: No  . Sexual activity: Not Asked   Other Topics Concern  . None   Social History Narrative  . None    Additional Social History: Patient born and raised in [place].  He is only child.  His parents lived together.  He married once for 13 years but marriage fall apart.  He has 2 children from his first marriage.  He has a partial custody for his 29 year old daughter in full custody for his 96 year old son.  He married 4 years ago and his current wife has 2 children.  Currently they are living separated because there is too much tension between 2  families.  Patient is working at [employer], educational.  Allergies:  No Active Allergies  Metabolic Disorder Labs: Lab Results  Component Value Date   HGBA1C 6.1 02/27/2014   No results found for: PROLACTIN No results found for: CHOL, TRIG, HDL, CHOLHDL, VLDL, LDLCALC   Current Medications: Current Outpatient Prescriptions  Medication Sig Dispense Refill  . clonazePAM (KLONOPIN) 1 MG tablet Take 1 mg by mouth 2 (two) times daily. Takes 0.5mg  prn    . sildenafil (REVATIO) 20 MG tablet Take 1-5 tablets (20-100 mg total) by mouth daily as needed. 50 tablet 0  . venlafaxine XR (EFFEXOR-XR) 150 MG 24 hr capsule Take 150 mg by mouth daily.    Marland Kitchen lamoTRIgine (LAMICTAL) 25 MG tablet Take 1 tab daily for 1 week and than 2 tab daily 60 tablet 1  .  venlafaxine XR (EFFEXOR XR) 75 MG 24 hr capsule Take 1 capsule (75 mg total) by mouth daily. 30 capsule 1   No current facility-administered medications for this visit.     Neurologic: Headache: No Seizure: No Paresthesias:No  Musculoskeletal: Strength & Muscle Tone: within normal limits Gait & Station: normal Patient leans: N/A  Psychiatric Specialty Exam: Review of Systems  Constitutional: Positive for malaise/fatigue and weight loss.  HENT: Negative.   Respiratory: Negative.   Cardiovascular: Negative.   Musculoskeletal: Negative.   Skin: Negative.   Neurological: Positive for weakness.  Psychiatric/Behavioral: Positive for depression. The patient is nervous/anxious.     Blood pressure 122/74, pulse 69, height 5' 8.5" (1.74 m), weight 164 lb 3.2 oz (74.5 kg).Body mass index is 24.6 kg/m.  General Appearance: Casual and Guarded  Eye Contact:  Fair  Speech:  Slow  Volume:  Normal  Mood:  Anxious, Depressed and Dysphoric  Affect:  Constricted  Thought Process:  Goal Directed  Orientation:  Full (Time, Place, and Person)  Thought Content:  Rumination  Suicidal Thoughts:  No  Homicidal Thoughts:  No  Memory:  Immediate;    Good Recent;   Good Remote;   Good  Judgement:  Fair  Insight:  Good  Psychomotor Activity:  Normal  Concentration:  Concentration: Fair and Attention Span: Fair  Recall:  Good  Fund of Knowledge:Good  Language: Good  Akathisia:  No  Handed:  Right  AIMS (if indicated):  0  Assets:  Communication Skills Desire for Improvement Housing Resilience Social Support  ADL's:  Intact  Cognition: WNL  Sleep:  Good    Assessment: Bipolar disorder, depressed.  Anxiety disorder NOS.  Plan: I review his symptoms, history, current medication and psychosocial stressors.  Patient had tried numerous antidepressant limited response.  I recommended to try Lamictal to help mood lability and depression.  I will also increase Effexor to 225 mg as patient see partial response with one 50 mg.  So far he is tolerating his medication and reported no side effects.  He is taking Klonopin 1 mg as needed via Diu discuss benzodiazepine dependence tolerance and withdrawal.  I would also do blood work since patient is complaining of a lot of physical symptoms including fatigue, lack of appetite and feeling tired.  He has no blood work for a while.  I also offered counseling but patient declined.  Discussed medication side effects especially if Lamictal causes rash then he need to stop the medication immediately.  We will try Lamictal 25 mg daily for 1 week and then 50 mg daily.  Treatment plan discuss in detail with the patient and his wife.  Recommended to call us back if he has any question, concern or if he feel worsening of the symptom.  Follow-up in 4-5 weeks.  Discuss safety plan that anytime having active suicidal thoughts or homicidal thought that he need to call 911 or go to the local emergency room.  Alaira Level T., MD 6/22/201811:17 AM

## 2017-04-07 LAB — CBC WITH DIFFERENTIAL/PLATELET
BASOS ABS: 0 {cells}/uL (ref 0–200)
Basophils Relative: 0 %
EOS PCT: 1 %
Eosinophils Absolute: 75 cells/uL (ref 15–500)
HCT: 46.1 % (ref 38.5–50.0)
HEMOGLOBIN: 16.1 g/dL (ref 13.2–17.1)
LYMPHS PCT: 19 %
Lymphs Abs: 1425 cells/uL (ref 850–3900)
MCH: 32.3 pg (ref 27.0–33.0)
MCHC: 34.9 g/dL (ref 32.0–36.0)
MCV: 92.4 fL (ref 80.0–100.0)
MPV: 10.7 fL (ref 7.5–12.5)
Monocytes Absolute: 600 cells/uL (ref 200–950)
Monocytes Relative: 8 %
Neutro Abs: 5400 cells/uL (ref 1500–7800)
Neutrophils Relative %: 72 %
Platelets: 248 10*3/uL (ref 140–400)
RBC: 4.99 MIL/uL (ref 4.20–5.80)
RDW: 13.3 % (ref 11.0–15.0)
WBC: 7.5 10*3/uL (ref 3.8–10.8)

## 2017-04-08 LAB — COMPLETE METABOLIC PANEL WITH GFR
ALBUMIN: 4.3 g/dL (ref 3.6–5.1)
ALK PHOS: 57 U/L (ref 40–115)
ALT: 20 U/L (ref 9–46)
AST: 14 U/L (ref 10–35)
BUN: 20 mg/dL (ref 7–25)
CALCIUM: 9.4 mg/dL (ref 8.6–10.3)
CHLORIDE: 102 mmol/L (ref 98–110)
CO2: 25 mmol/L (ref 20–31)
Creat: 1.15 mg/dL (ref 0.70–1.33)
GFR, EST NON AFRICAN AMERICAN: 74 mL/min (ref >=60)
GFR, Est African American: 85 mL/min (ref >=60)
Glucose, Bld: 89 mg/dL (ref 65–99)
POTASSIUM: 4.3 mmol/L (ref 3.5–5.3)
SODIUM: 140 mmol/L (ref 135–146)
Total Bilirubin: 0.5 mg/dL (ref 0.2–1.2)
Total Protein: 7 g/dL (ref 6.1–8.1)

## 2017-04-08 LAB — HEMOGLOBIN A1C
HEMOGLOBIN A1C: 5.5 % (ref <5.7)
Mean Plasma Glucose: 111 mg/dL

## 2017-04-08 LAB — TSH: TSH: 0.58 m[IU]/L (ref 0.40–4.50)

## 2017-04-08 LAB — TESTOSTERONE: Testosterone: 134 ng/dL — ABNORMAL LOW (ref 250–827)

## 2017-04-12 LAB — VITAMIN D 1,25 DIHYDROXY
VITAMIN D 1, 25 (OH) TOTAL: 31 pg/mL (ref 18–72)
VITAMIN D3 1, 25 (OH): 31 pg/mL
Vitamin D2 1, 25 (OH)2: <8 pg/mL

## 2017-04-13 ENCOUNTER — Telehealth (HOSPITAL_COMMUNITY): Payer: Self-pay

## 2017-04-13 ENCOUNTER — Other Ambulatory Visit: Payer: Self-pay | Admitting: Family

## 2017-04-13 DIAGNOSIS — N528 Other male erectile dysfunction: Secondary | ICD-10-CM

## 2017-04-13 MED ORDER — SILDENAFIL CITRATE 20 MG PO TABS: 20.0000 mg | ORAL_TABLET | Freq: Every day | ORAL | 0 refills | Status: DC | PRN

## 2017-04-13 NOTE — Telephone Encounter (Signed)
Please call patient about his lab results, thank you

## 2017-04-15 ENCOUNTER — Telehealth: Payer: Self-pay

## 2017-04-15 NOTE — Telephone Encounter (Signed)
Key: [id number]

## 2017-04-19 NOTE — Telephone Encounter (Signed)
I called patient to discuss blood work results but no one picked up the phone.  I left a message to call us back.

## 2017-04-21 ENCOUNTER — Encounter: Payer: Self-pay | Admitting: Family

## 2017-04-21 ENCOUNTER — Ambulatory Visit (INDEPENDENT_AMBULATORY_CARE_PROVIDER_SITE_OTHER): Payer: 59 | Admitting: Family

## 2017-04-21 VITALS — BP 124/78 | HR 97 | Temp 98.6°F | Resp 16 | Ht 68.5 in | Wt 161.0 lb

## 2017-04-21 DIAGNOSIS — R7989 Other specified abnormal findings of blood chemistry: Secondary | ICD-10-CM

## 2017-04-21 NOTE — Assessment & Plan Note (Addendum)
Low testosterone noted on most recent blood work with increased symptoms of fatigue, depression, anxiety, and decreased energy. Testosterone level most recently tested was at 2:00 in the afternoon. Retest testosterone between 8-10 AM. Obtain PSA, LSH and FSH. Consider testosterone replacement therapy if testosterone remains low for primary or secondary hypogonadism. Cannot rule out cardiovascular disease. Follow-up pending blood work.

## 2017-04-21 NOTE — Progress Notes (Signed)
Subjective:    Patient ID: Jermaine Kidd, male    DOB: April 20, 1966, 51 y.o.   MRN: [id number]  Chief Complaint  Patient presents with  . Follow-up    blood reading from 2 weeks ago showed low testosterone, states that he is extremely fatigue, depressed and grouchy     HPI:  Jermaine Kidd is a 51 y.o. male who  has a past medical history of Anxiety and Anxiety disorder. and presents today for a follow up office.   Associated symptoms of anxiety, fatigue, depressed and grouchy has been going on for about 7-10 months. Seen by Dr. Adele Schilder of psychiatry and noted to have a low testerosterone level.  Has had previous low testosterone levels. Thyroid function was normal. Sleeping well at night with lack of motivation to get up in the morning. Currently maintained on Effexor, Lamictal, and clonazepam. No suicidal ideation.   No Active Allergies    Outpatient Medications Prior to Visit  Medication Sig Dispense Refill  . clonazePAM (KLONOPIN) 1 MG tablet Take 1 mg by mouth 2 (two) times daily. Takes 0.5mg  prn    . lamoTRIgine (LAMICTAL) 25 MG tablet Take 1 tab daily for 1 week and than 2 tab daily 60 tablet 1  . sildenafil (REVATIO) 20 MG tablet Take 1-5 tablets (20-100 mg total) by mouth daily as needed. 50 tablet 0  . venlafaxine XR (EFFEXOR-XR) 150 MG 24 hr capsule Take 150 mg by mouth daily.    Marland Kitchen venlafaxine XR (EFFEXOR XR) 75 MG 24 hr capsule Take 1 capsule (75 mg total) by mouth daily. 30 capsule 1   No facility-administered medications prior to visit.      Review of Systems  Constitutional: Positive for appetite change and fatigue. Negative for chills and fever.  Respiratory: Negative for chest tightness and shortness of breath.   Cardiovascular: Negative for chest pain, palpitations and leg swelling.  Psychiatric/Behavioral: Positive for dysphoric mood. Negative for sleep disturbance and suicidal ideas. The patient is nervous/anxious.       Objective:    BP 124/78 (BP  Location: Left Arm, Patient Position: Sitting, Cuff Size: Large)   Pulse 97   Temp 98.6 F (37 C) (Oral)   Resp 16   Ht 5' 8.5" (1.74 m)   Wt 161 lb (73 kg)   SpO2 98%   BMI 24.12 kg/m  Nursing note and vital signs reviewed.  Physical Exam  Constitutional: He is oriented to person, place, and time. He appears well-developed and well-nourished. No distress.  Cardiovascular: Normal rate, regular rhythm, normal heart sounds and intact distal pulses.   Pulmonary/Chest: Effort normal and breath sounds normal.  Neurological: He is alert and oriented to person, place, and time.  Skin: Skin is warm and dry.  Psychiatric: His speech is normal and behavior is normal. Judgment and thought content normal. His mood appears anxious. Cognition and memory are normal.       Assessment & Plan:   Problem List Items Addressed This Visit      Other   Low serum testosterone level - Primary    Low testosterone noted on most recent blood work with increased symptoms of fatigue, depression, anxiety, and decreased energy. Testosterone level most recently tested was at 2:00 in the afternoon. Retest testosterone between 8-10 AM. Obtain PSA, LSH and FSH. Consider testosterone replacement therapy if testosterone remains low for primary or secondary hypogonadism. Cannot rule out cardiovascular disease. Follow-up pending blood work.      Relevant Orders  Testosterone   FSH   Luteinizing hormone   PSA       I am having Mr. Sahagian maintain his clonazePAM, venlafaxine XR, lamoTRIgine, and sildenafil.   Follow-up: Return if symptoms worsen or fail to improve.  Mauricio Po, FNP

## 2017-04-21 NOTE — Patient Instructions (Signed)
Thank you for choosing [facility].  SUMMARY AND INSTRUCTIONS:  Please check on Androgel, Testim, Fortesta, Axiron  Injections: Testosterone cypionate, testosterone enanthate  Medication:  Your prescription(s) have been submitted to your pharmacy or been printed and provided for you. Please take as directed and contact our office if you believe you are having problem(s) with the medication(s) or have any questions.  Labs:  Please stop by the lab on the lower level of the building for your blood work. Your results will be released to [facility] (or called to you) after review, usually within 72 hours after test completion. If any changes need to be made, you will be notified at that same time.  1.) The lab is open from 7:30am to 5:30 pm Monday-Friday 2.) No appointment is necessary 3.) Fasting (if needed) is 6-8 hours after food and drink; black coffee and water are okay   Imaging / Radiology:  Please stop by radiology on the basement level of the building for your x-rays. Your results will be released to [facility] (or called to you) after review, usually within 72 hours after test completion. If any treatments or changes are necessary, you will be notified at that same time.  Referrals:  Referrals have been made during this visit. You should expect to hear back from our schedulers in about 7-10 days in regards to establishing an appointment with the specialists we discussed.   Follow up:  If your symptoms worsen or fail to improve, please contact our office for further instruction, or in case of emergency go directly to the emergency room at the closest medical facility.   Testosterone injection What is this medicine? TESTOSTERONE (tes TOS ter one) is the main male hormone. It supports normal male development such as muscle growth, facial hair, and deep voice. It is used in males to treat low testosterone levels. This medicine may be used for other purposes; ask your health care  provider or pharmacist if you have questions. COMMON BRAND NAME(S): Andro-L.A., Aveed, Delatestryl, Depo-Testosterone, Virilon What should I tell my health care provider before I take this medicine? They need to know if you have any of these conditions: -cancer -diabetes -heart disease -kidney disease -liver disease -lung disease -prostate disease -an unusual or allergic reaction to testosterone, other medicines, foods, dyes, or preservatives -pregnant or trying to get pregnant -breast-feeding How should I use this medicine? This medicine is for injection into a muscle. It is usually given by a health care professional in a hospital or clinic setting. Contact your pediatrician regarding the use of this medicine in children. While this medicine may be prescribed for children as young as 28 years of age for selected conditions, precautions do apply. Overdosage: If you think you have taken too much of this medicine contact a poison control center or emergency room at once. NOTE: This medicine is only for you. Do not share this medicine with others. What if I miss a dose? Try not to miss a dose. Your doctor or health care professional will tell you when your next injection is due. Notify the office if you are unable to keep an appointment. What may interact with this medicine? -medicines for diabetes -medicines that treat or prevent blood clots like warfarin -oxyphenbutazone -propranolol -steroid medicines like prednisone or cortisone This list may not describe all possible interactions. Give your health care provider a list of all the medicines, herbs, non-prescription drugs, or dietary supplements you use. Also tell them if you smoke, drink alcohol, or  use illegal drugs. Some items may interact with your medicine. What should I watch for while using this medicine? Visit your doctor or health care professional for regular checks on your progress. They will need to check the level of  testosterone in your blood. This medicine is only approved for use in men who have low levels of testosterone related to certain medical conditions. Heart attacks and strokes have been reported with the use of this medicine. Notify your doctor or health care professional and seek emergency treatment if you develop breathing problems; changes in vision; confusion; chest pain or chest tightness; sudden arm pain; severe, sudden headache; trouble speaking or understanding; sudden numbness or weakness of the face, arm or leg; loss of balance or coordination. Talk to your doctor about the risks and benefits of this medicine. This medicine may affect blood sugar levels. If you have diabetes, check with your doctor or health care professional before you change your diet or the dose of your diabetic medicine. Testosterone injections are not commonly used in women. Women should inform their doctor if they wish to become pregnant or think they might be pregnant. There is a potential for serious side effects to an unborn child. Talk to your health care professional or pharmacist for more information. Talk with your doctor or health care professional about your birth control options while taking this medicine. This drug is banned from use in athletes by most athletic organizations. What side effects may I notice from receiving this medicine? Side effects that you should report to your doctor or health care professional as soon as possible: -allergic reactions like skin rash, itching or hives, swelling of the face, lips, or tongue -breast enlargement -breathing problems -changes in emotions or moods -deep or hoarse voice -irregular menstrual periods -signs and symptoms of liver injury like dark yellow or brown urine; general ill feeling or flu-like symptoms; light-colored stools; loss of appetite; nausea; right upper belly pain; unusually weak or tired; yellowing of the eyes or skin -stomach pain -swelling of the  ankles, feet, hands -too frequent or persistent erections -trouble passing urine or change in the amount of urine Side effects that usually do not require medical attention (report to your doctor or health care professional if they continue or are bothersome): -acne -change in sex drive or performance -facial hair growth -hair loss -headache This list may not describe all possible side effects. Call your doctor for medical advice about side effects. You may report side effects to FDA at 1-800-FDA-1088. Where should I keep my medicine? Keep out of the reach of children. This medicine can be abused. Keep your medicine in a safe place to protect it from theft. Do not share this medicine with anyone. Selling or giving away this medicine is dangerous and against the law. Store at room temperature between 20 and 25 degrees C (68 and 77 degrees F). Do not freeze. Protect from light. Follow the directions for the product you are prescribed. Throw away any unused medicine after the expiration date. NOTE: This sheet is a summary. It may not cover all possible information. If you have questions about this medicine, talk to your doctor, pharmacist, or health care provider.  2018 Elsevier/Gold Standard (2015-11-02 07:33:55)  Testosterone skin gel What is this medicine? TESTOSTERONE (tes TOS ter one) is the main male hormone. It supports normal male traits such as muscle growth, facial hair, and deep voice. This gel is used in males to treat low testosterone levels. This medicine may  be used for other purposes; ask your health care provider or pharmacist if you have questions. COMMON BRAND NAME(S): AndroGel, FORTESTA, Testim, Vogelxo What should I tell my health care provider before I take this medicine? They need to know if you have any of these conditions: -breast cancer -diabetes -heart disease -if a male partner is pregnant or trying to get pregnant -kidney disease -liver disease -lung  disease -prostate cancer, enlargement -an unusual or allergic reaction to testosterone, soy proteins, other medicines, foods, dyes, or preservatives -pregnant or trying to get pregnant -breast-feeding How should I use this medicine? This medicine is for external use only. This medicine is applied at the same time every day (preferably in the morning) to clean, dry, intact skin. If you take a bath or shower in the morning, apply the gel after the bath or shower. Follow the directions on the prescription label. Make sure that you are using your testosterone gel product correctly and applying it only to the appropriate skin area (see below). Allow the skin to dry a few minutes then cover with clothing to prevent others from coming in contact with the medicine on your skin. The gel is flammable. Avoid fire, flame, or smoking until the gel has dried. Wash your hands with soap and water after use. For AndroGel 1% Packets: Open the packet(s) needed for your dose. You can put the entire dose into your palm all at once or just a little at a time to apply. If you prefer, you can instead squeeze the gel directly onto the area you are applying it to. Apply on the shoulders, upper arm, or abdomen as directed. Do not apply to the scrotum or genitals. Be sure you use the correct total dose. It is best to wait 5 to 6 hours after application of the gel before showering or swimming. For AndroGel 1%: Pump the dose into the palm of your hand. You can put the entire dose into your palm all at once or just a little at a time to apply. If you prefer, you can instead pump the gel directly onto the area you are applying it to. Apply on the shoulders, upper arm, or abdomen as directed. Do not apply to the scrotum or genitals. Be sure you use the correct total dose. It is best to wait for 5 to 6 hours after application of the gel before showering or swimming. For Androgel 1.62% packets: Open the packet(s) needed for your dose. You  can put the entire dose into your palm all at once or just a little at a time to apply. If you prefer, you can instead squeeze the gel directly onto the area you are applying it to. Apply on the shoulders and upper arms as directed. Do not apply to other parts of the body including the abdomen, genitals, chest, armpits, or knees. Be sure you use the correct total dose. It is best to wait 2 hours after application of the gel before washing, showering, or swimming. For AndroGel 1.62%: Pump the dose into the palm of your hand. Dispense one pump of gel at a time into the palm of your hand before applying it. If you prefer, you can instead pump the gel directly onto the area you are applying it to. Apply on the shoulders and upper arms as directed. Do not apply to other parts of the body including the abdomen, genitals, chest, armpits, or knees. Be sure you use the correct total dose. It is best to wait  2 hours after application of the gel before washing, showering, or swimming. For Testim: Open the tube(s) needed for your dose. Squeeze the gel from the tube into the palm of your hand. Apply on the shoulders or upper arms as directed. Do not apply to the scrotum, genitals, or abdomen. Be sure you use the correct total dose. Do not shower or swim for at least 2 hours after application of the gel. For Fortesta: Use the multi-dose pump to pump the gel directly onto the area you are applying it to. Apply on the thighs as directed. Do not apply to the abdomen, penis, scrotum, shoulders or upper arms. Gently rub the gel onto the skin using your finger. Be sure you use the correct total dose. Do not shower or swim for at least 2 hours after application of the gel. A special MedGuide will be given to you by the pharmacist with each prescription and refill. Be sure to read this information carefully each time. Talk to your pediatrician regarding the use of this medicine in children. Special care may be needed. Overdosage:  If you think you have taken too much of this medicine contact a poison control center or emergency room at once. NOTE: This medicine is only for you. Do not share this medicine with others. What if I miss a dose? If you miss a dose, use it as soon as you can. If it is almost time for your next dose, use only that dose. Do not use double or extra doses. What may interact with this medicine? -medicines for diabetes -medicines that treat or prevent blood clots like warfarin -oxyphenbutazone -propranolol -steroid medicines like prednisone or cortisone This list may not describe all possible interactions. Give your health care provider a list of all the medicines, herbs, non-prescription drugs, or dietary supplements you use. Also tell them if you smoke, drink alcohol, or use illegal drugs. Some items may interact with your medicine. What should I watch for while using this medicine? Visit your doctor or health care professional for regular checks on your progress. They will need to check the level of testosterone in your blood. This medicine is only approved for use in men who have low levels of testosterone related to certain medical conditions. Heart attacks and strokes have been reported with the use of this medicine. Notify your doctor or health care professional and seek emergency treatment if you develop breathing problems; changes in vision; confusion; chest pain or chest tightness; sudden arm pain; severe, sudden headache; trouble speaking or understanding; sudden numbness or weakness of the face, arm or leg; loss of balance or coordination. Talk to your doctor about the risks and benefits of this medicine. This medicine can transfer from your body to others. If a person or pet comes in contact with the area where this medicine was applied to your skin, they may have a serious risk of side effects. If you cannot avoid skin-to-skin contact with another person, make sure the site where this medicine  was applied is covered with clothing. If accidental contact happens, the skin of the person or pet should be washed right away with soap and water. Also, a male partner who is pregnant or trying to get pregnant should avoid contact with the gel or treated skin. This medicine may affect blood sugar levels. If you have diabetes, check with your doctor or health care professional before you change your diet or the dose of your diabetic medicine. This drug is banned from use  in athletes by most athletic organizations. What side effects may I notice from receiving this medicine? Side effects that you should report to your doctor or health care professional as soon as possible: -allergic reactions like skin rash, itching or hives, swelling of the face, lips, or tongue -breast enlargement -breathing problems -changes in mood, especially anger, depression, or rage -dark urine -general ill feeling or flu-like symptoms -light-colored stools -loss of appetite, nausea -nausea, vomiting -right upper belly pain -stomach pain -swelling of ankles -too frequent or persistent erections -trouble passing urine or change in the amount of urine -unusually weak or tired -yellowing of the eyes or skin Side effects that usually do not require medical attention (report to your doctor or health care professional if they continue or are bothersome): -acne -change in sex drive or performance -hair loss -headache This list may not describe all possible side effects. Call your doctor for medical advice about side effects. You may report side effects to FDA at 1-800-FDA-1088. Where should I keep my medicine? Keep out of the reach of children. This medicine can be abused. Keep your medicine in a safe place to protect it from theft. Do not share this medicine with anyone. Selling or giving away this medicine is dangerous and against the law. Store at room temperature between 15 to 30 degrees C (59 to 86 degrees F).  Keep closed until use. Protect from heat and light. This medicine is flammable. Avoid exposure to heat, fire, flame, and smoking. Throw away any unused medicine after the expiration date. NOTE: This sheet is a summary. It may not cover all possible information. If you have questions about this medicine, talk to your doctor, pharmacist, or health care provider.  2018 Elsevier/Gold Standard (2013-12-14 08:27:26)

## 2017-04-22 ENCOUNTER — Other Ambulatory Visit (INDEPENDENT_AMBULATORY_CARE_PROVIDER_SITE_OTHER): Payer: 59

## 2017-04-22 ENCOUNTER — Encounter: Payer: Self-pay | Admitting: Family

## 2017-04-22 DIAGNOSIS — R7989 Other specified abnormal findings of blood chemistry: Secondary | ICD-10-CM | POA: Diagnosis not present

## 2017-04-22 LAB — LUTEINIZING HORMONE: LH: 4.06 m[IU]/mL (ref 1.50–9.30)

## 2017-04-22 LAB — TESTOSTERONE: TESTOSTERONE: 349.89 ng/dL (ref 300.00–890.00)

## 2017-04-22 LAB — PSA: PSA: 1.42 ng/mL (ref 0.10–4.00)

## 2017-04-22 LAB — FOLLICLE STIMULATING HORMONE: FSH: 4.2 m[IU]/mL (ref 1.4–18.1)

## 2017-05-11 ENCOUNTER — Encounter (HOSPITAL_COMMUNITY): Payer: Self-pay | Admitting: Psychiatry

## 2017-05-11 ENCOUNTER — Ambulatory Visit (INDEPENDENT_AMBULATORY_CARE_PROVIDER_SITE_OTHER): Payer: 59 | Admitting: Psychiatry

## 2017-05-11 VITALS — BP 118/70 | HR 94 | Ht 69.0 in | Wt 161.8 lb

## 2017-05-11 DIAGNOSIS — F419 Anxiety disorder, unspecified: Secondary | ICD-10-CM

## 2017-05-11 DIAGNOSIS — F3130 Bipolar disorder, current episode depressed, mild or moderate severity, unspecified: Secondary | ICD-10-CM | POA: Diagnosis not present

## 2017-05-11 DIAGNOSIS — Z810 Family history of intellectual disabilities: Secondary | ICD-10-CM

## 2017-05-11 MED ORDER — LAMOTRIGINE 100 MG PO TABS: 100.0000 mg | ORAL_TABLET | Freq: Every day | ORAL | 1 refills | Status: DC

## 2017-05-11 MED ORDER — BUSPIRONE HCL 5 MG PO TABS: 5.0000 mg | ORAL_TABLET | Freq: Two times a day (BID) | ORAL | 1 refills | Status: DC

## 2017-05-11 NOTE — Progress Notes (Signed)
[facility] MD/PA/NP OP Progress Note  05/11/2017 8:16 AM Jermaine Kidd  MRN:  [id number]  Chief Complaint:  Subjective:  I still feel anxious and nervous.  But my mood is getting better.  HPI: Patient came for his follow-up appointment with his wife.  He is 51 year old Caucasian employed married man who was seen 6 weeks ago as initial evaluation.  He was not happy with his previous psychiatrist.  He was taking Effexor and Klonopin but did not see any improvement in his mood irritability anger, motivation.  Patient has history of mood swings anger and impulsive behavior.  Currently he's been separated from his wife but is still they have a good relationship.  We started him on Lamictal.  He is taking 50 mg.  His wife endorsed that his mood is somewhat better and he is not irritable or angry.  However patient remain very anxious.  He had blood work and repeat testosterone level.  Now his testosterone level is normal.  We also recommended to try extra Effexor 75 mg in the evening because he was feeling very tired and exhausted.  However taking extra Effexor for few days he did not see any improvement and decided to discontinue evening Effexor.  He is taking Klonopin 1 mg in the morning and sometime half in the afternoon.  His job is going well.  He denies any suicidal thoughts, homicidal thought, hallucination, paranoia or any aggressive behavior.  His energy level is fair.  He admitted his depression is better but he remains very anxious.  Patient denies drinking alcohol or using any illegal substances.  His appetite is okay .  His vital signs are stable.  He has no rash, itching, tremors or shakes.  He like to try something that is not addictive for his anxiety.    Visit Diagnosis:    ICD-10-CM   1. Bipolar I disorder, most recent episode depressed (HCC) F31.30 lamoTRIgine (LAMICTAL) 100 MG tablet    busPIRone (BUSPAR) 5 MG tablet    Past Psychiatric History: Reviewed. Patient denies any history of  psychiatric inpatient treatment or any suicidal attempt.  He started seeing psychiatrist at [facility] but he was not happy with the treatment and then he started per Dr. Toy Cookey.  He tried Prozac, Ambien, Cymbalta, Paxil, Wellbutrin, Lexapro, Seroquel and Klonopin.  He reported sexual side effects with most of the antidepressant.  He admitted history of irritability, impulsive behavior and depression.  Past Medical History:  Past Medical History:  Diagnosis Date  . Anxiety   . Anxiety disorder    onset age 34    Past Surgical History:  Procedure Laterality Date  . FACIAL RECONSTRUCTION SURGERY  2005   baseball injury  . SHOULDER SURGERY  2004    Family Psychiatric History: Reviewed.  Family History:  Family History  Problem Relation Age of Onset  . Osteoarthritis Father   . Alzheimer's disease Paternal Grandmother   . Alzheimer's disease Maternal Aunt   . Heart disease Maternal Grandmother   . Diabetes Neg Hx   . Cancer Neg Hx   . Stroke Neg Hx     Social History:  Social History   Social History  . Marital status: Married    Spouse name: [name]  . Number of children: 4  . Years of education: college   Occupational History  . Sales    Social History Main Topics  . Smoking status: Never Smoker  . Smokeless tobacco: Current User    Types: Chew  .  Alcohol use No  . Drug use: No  . Sexual activity: Not Asked   Other Topics Concern  . None   Social History Narrative  . None    Allergies: No Active Allergies  Metabolic Disorder Labs: Recent Results (from the past 2160 hour(s))  CBC with Differential/Platelet     Status: None   Collection Time: 04/07/17  2:13 PM  Result Value Ref Range   WBC 7.5 3.8 - 10.8 K/uL   RBC 4.99 4.20 - 5.80 MIL/uL   Hemoglobin 16.1 13.2 - 17.1 g/dL   HCT 46.1 38.5 - 50.0 %   MCV 92.4 80.0 - 100.0 fL   MCH 32.3 27.0 - 33.0 pg   MCHC 34.9 32.0 - 36.0 g/dL   RDW 13.3 11.0 - 15.0 %   Platelets 248 140 - 400 K/uL   MPV 10.7  7.5 - 12.5 fL   Neutro Abs 5,400 1,500 - 7,800 cells/uL   Lymphs Abs 1,425 850 - 3,900 cells/uL   Monocytes Absolute 600 200 - 950 cells/uL   Eosinophils Absolute 75 15 - 500 cells/uL   Basophils Absolute 0 0 - 200 cells/uL   Neutrophils Relative % 72 %   Lymphocytes Relative 19 %   Monocytes Relative 8 %   Eosinophils Relative 1 %   Basophils Relative 0 %   Smear Review Criteria for review not met   COMPLETE METABOLIC PANEL WITH GFR     Status: None   Collection Time: 04/07/17  2:13 PM  Result Value Ref Range   Sodium 140 135 - 146 mmol/L   Potassium 4.3 3.5 - 5.3 mmol/L   Chloride 102 98 - 110 mmol/L   CO2 25 20 - 31 mmol/L   Glucose, Bld 89 65 - 99 mg/dL   BUN 20 7 - 25 mg/dL   Creat 1.15 0.70 - 1.33 mg/dL    Comment:   For patients > or = 50 years of age: The upper reference limit for Creatinine is approximately 13% higher for people identified as African-American.      Total Bilirubin 0.5 0.2 - 1.2 mg/dL   Alkaline Phosphatase 57 40 - 115 U/L   AST 14 10 - 35 U/L   ALT 20 9 - 46 U/L   Total Protein 7.0 6.1 - 8.1 g/dL   Albumin 4.3 3.6 - 5.1 g/dL   Calcium 9.4 8.6 - 10.3 mg/dL   GFR, Est African American 85 >=60 mL/min   GFR, Est Non African American 74 >=60 mL/min  Hemoglobin A1c     Status: None   Collection Time: 04/07/17  2:13 PM  Result Value Ref Range   Hgb A1c MFr Bld 5.5 <5.7 %    Comment:   For the purpose of screening for the presence of diabetes:   <5.7%       Consistent with the absence of diabetes 5.7-6.4 %   Consistent with increased risk for diabetes (prediabetes) >=6.5 %     Consistent with diabetes   This assay result is consistent with a decreased risk of diabetes.   Currently, no consensus exists regarding use of hemoglobin A1c for diagnosis of diabetes in children.   According to American Diabetes Association (ADA) guidelines, hemoglobin A1c <7.0% represents optimal control in non-pregnant diabetic patients. Different metrics may apply to  specific patient populations. Standards of Medical Care in Diabetes (ADA).      Mean Plasma Glucose 111 mg/dL  TSH     Status: None   Collection Time:  04/07/17  2:13 PM  Result Value Ref Range   TSH 0.58 0.40 - 4.50 mIU/L  Vitamin D 1,25 dihydroxy     Status: None   Collection Time: 04/07/17  2:13 PM  Result Value Ref Range   Vitamin D 1, 25 (OH)2 Total 31 18 - 72 pg/mL   Vitamin D3 1, 25 (OH)2 31 pg/mL   Vitamin D2 1, 25 (OH)2 <8 pg/mL    Comment: Vitamin D3, 1,25(OH)2 indicates both endogenous production and supplementation.  Vitamin D2, 1,25(OH)2 is an indicator of exogeous sources, such as diet or supplementation.  Interpretation and therapy are based on measurement of Vitamin D,1,25(OH)2, Total. This test was developed and its analytical performance characteristics have been determined by [facility], [place], [place]. It has not been cleared or approved by the FDA. This assay has been validated pursuant to the CLIA regulations and is used for clinical purposes.   Testosterone     Status: Abnormal   Collection Time: 04/07/17  2:13 PM  Result Value Ref Range   Testosterone 134 (L) 250 - 827 ng/dL    Comment:   In hypogonadal males, Testosterone, Total, LC/MS/MS is the recommended assay due to the diminished accuracy of immunoassay at levels below 250 ng/dL.This test code [phone]) must be collected in a red-top tube with no gel.   Testosterone     Status: None   Collection Time: 04/22/17  9:11 AM  Result Value Ref Range   Testosterone 349.89 300.00 - 890.00 ng/dL  FSH     Status: None   Collection Time: 04/22/17  9:11 AM  Result Value Ref Range   FSH 4.2 1.4 - 18.1 mIU/ML    Comment: Male Reference Range:  1.4-18.1 mIU/mLFemale Reference Range:Follicular Phase          2.5-10.2 mIU/mLMidCycle Peak          3.4-33.4 mIU/mLLuteal Phase          1.5-9.1 mIU/mLPost Menopausal     23.0-116.3 mIU/mLPregnant          <0.3 mIU/mL  Luteinizing hormone      Status: None   Collection Time: 04/22/17  9:11 AM  Result Value Ref Range   LH 4.06 1.50 - 9.30 mIU/mL    Comment: Male Reference Range:20-70 yrs     1.5-9.3 mIU/mL>70 yrs       3.1-35.6 mIU/mLFemale Reference Range:Follicular Phase     6.2-37.6 mIU/mLMidcycle             8.7-76.3 mIU/mLLuteal Phase         0.5-16.9 mIU/mL  Post Menopausal      15.9-54.0  mIU/mLPregnant             <1.5 mIU/mLContraceptives       0.7-5.6 mIU/mL   PSA     Status: None   Collection Time: 04/22/17  9:11 AM  Result Value Ref Range   PSA 1.42 0.10 - 4.00 ng/mL   Lab Results  Component Value Date   HGBA1C 5.5 04/07/2017   MPG 111 04/07/2017   No results found for: PROLACTIN No results found for: CHOL, TRIG, HDL, CHOLHDL, VLDL, LDLCALC   Current Medications: Current Outpatient Prescriptions  Medication Sig Dispense Refill  . clonazePAM (KLONOPIN) 1 MG tablet Take 1 mg by mouth 2 (two) times daily. Takes 0.71m prn    . lamoTRIgine (LAMICTAL) 25 MG tablet Take 1 tab daily for 1 week and than 2 tab daily 60 tablet 1  . sildenafil (REVATIO) 20  MG tablet Take 1-5 tablets (20-100 mg total) by mouth daily as needed. 50 tablet 0  . venlafaxine XR (EFFEXOR-XR) 150 MG 24 hr capsule Take 150 mg by mouth daily.     No current facility-administered medications for this visit.     Neurologic: Headache: No Seizure: No Paresthesias: No  Musculoskeletal: Strength & Muscle Tone: within normal limits Gait & Station: normal Patient leans: N/A  Psychiatric Specialty Exam: Review of Systems  Constitutional: Negative.   HENT: Negative.   Respiratory: Negative.   Cardiovascular: Negative.   Gastrointestinal: Negative.   Genitourinary: Negative.   Musculoskeletal: Negative.   Skin: Negative.  Negative for itching and rash.  Neurological: Negative.   Psychiatric/Behavioral: The patient is nervous/anxious.     Blood pressure 118/70, pulse 94, height _0  (1.753 m), weight 161 lb 12.8 oz (73.4 kg).Body mass index  is 23.89 kg/m.  General Appearance: Casual  Eye Contact:  Fair  Speech:  Slow  Volume:  Normal  Mood:  Anxious and Dysphoric  Affect:  Depressed  Thought Process:  Linear  Orientation:  Full (Time, Place, and Person)  Thought Content: Rumination   Suicidal Thoughts:  No  Homicidal Thoughts:  No  Memory:  Immediate;   Good Recent;   Good Remote;   Good  Judgement:  Good  Insight:  Good  Psychomotor Activity:  Normal  Concentration:  Concentration: Good and Attention Span: Fair  Recall:  Good  Fund of Knowledge: Good  Language: Good  Akathisia:  No  Handed:  Right  AIMS (if indicated):  0  Assets:  Communication Skills Desire for Improvement Housing Resilience Social Support  ADL's:  Intact  Cognition: WNL  Sleep:  Okay     Assessment: Bipolar disorder, depressed type.  Anxiety disorder NOS.  Plan: I reviewed records from his primary care physician and recent blood work results including testosterone level is normal.  Patient is showing some improvement with Lamictal.  I recommended to increase Lamictal 100 mg.  He has no rash, itching tremors or shakes.  In the future he like to come off from Effexor but I recommended to continue Effexor XR 150 mg daily for now.  I will also add low-dose BuSpar 5 mg twice a day to help anxiety.  Patient wants nonaddictive medication for his anxiety.  I also suggested that they should see a marriage counselor and I recommended to contact [facility] at [facility] or [facility] for counseling.  I also suggested that he should see a urologist as he is concerned about his fluctuating testosterone.  Discuss in detail medication side effects and benefits.  Reminded that if he had rash with the Lamictal that he needed to stop the medication immediately.  Recommended to call us back if he has any question, concern or if he feels worsening of the symptom.  I will see him again in 6 weeks.  Time spent 25 minutes.  ARFEEN,SYED T.,  MD 05/11/2017, 8:16 AM

## 2017-06-22 ENCOUNTER — Encounter (HOSPITAL_COMMUNITY): Payer: Self-pay | Admitting: Psychiatry

## 2017-06-22 ENCOUNTER — Ambulatory Visit (INDEPENDENT_AMBULATORY_CARE_PROVIDER_SITE_OTHER): Payer: 59 | Admitting: Psychiatry

## 2017-06-22 VITALS — BP 132/78 | HR 98 | Ht 69.5 in | Wt 166.0 lb

## 2017-06-22 DIAGNOSIS — F3130 Bipolar disorder, current episode depressed, mild or moderate severity, unspecified: Secondary | ICD-10-CM | POA: Diagnosis not present

## 2017-06-22 DIAGNOSIS — R450 Nervousness: Secondary | ICD-10-CM | POA: Diagnosis not present

## 2017-06-22 DIAGNOSIS — Z810 Family history of intellectual disabilities: Secondary | ICD-10-CM

## 2017-06-22 DIAGNOSIS — F419 Anxiety disorder, unspecified: Secondary | ICD-10-CM | POA: Diagnosis not present

## 2017-06-22 MED ORDER — VENLAFAXINE HCL ER 37.5 MG PO CP24: ORAL_CAPSULE | ORAL | 1 refills | Status: DC

## 2017-06-22 MED ORDER — BUSPIRONE HCL 10 MG PO TABS: 10.0000 mg | ORAL_TABLET | Freq: Two times a day (BID) | ORAL | 1 refills | Status: DC

## 2017-06-22 MED ORDER — LAMOTRIGINE 150 MG PO TABS: 150.0000 mg | ORAL_TABLET | Freq: Every day | ORAL | 1 refills | Status: DC

## 2017-06-22 NOTE — Progress Notes (Signed)
[facility] MD/PA/NP OP Progress Note  06/22/2017 8:21 AM Jermaine Kidd  MRN:  [id number]  Chief Complaint:  I still anxious and nervous.  I'm having sexual side effects from Effexor.  HPI: Patient came for his follow-up appointment.  He is taking Effexor 150 mg and also Klonopin 0.5 mg in the morning.  We started him on Lamictal and BuSpar.  He remains anxious nervous and continues to have mood lability and frustration.  Currently he is separated from his wife but is still had a good relationship.  He has no rash, itching, tremors or shakes.  However he admitted sexual side effects from the Effexor and he like to try a different medication.  Recently he had testosterone level which was normal.  He feel Lamictal helping his irritability and mood and denies any suicidal thoughts or homicidal thought.  His energy level is fair.  We recommended to see therapist but patient is not interested in counseling.  Patient denies drinking alcohol or using any illegal substances.  His appetite is okay.  His vital signs are stable.  Patient denies any hallucination, paranoia or any self abusive behavior.    Visit Diagnosis:    ICD-10-CM   1. Bipolar I disorder, most recent episode depressed (HCC) F31.30 lamoTRIgine (LAMICTAL) 150 MG tablet    busPIRone (BUSPAR) 10 MG tablet    venlafaxine XR (EFFEXOR-XR) 37.5 MG 24 hr capsule    Past Psychiatric History: Reviewed. Patient denies any history of psychiatric inpatient treatment or any suicidal attempt.  He started seeing psychiatrist at [facility] but he was not happy with the treatment and then he started per Dr. Toy Cookey.  He tried Prozac, Ambien, Cymbalta, Paxil, Wellbutrin, Lexapro, Seroquel and Klonopin.  He reported sexual side effects with most of the antidepressant.  He admitted history of irritability, impulsive behavior and depression.  Past Medical History:  Past Medical History:  Diagnosis Date  . Anxiety   . Anxiety disorder    onset age 80    Past  Surgical History:  Procedure Laterality Date  . FACIAL RECONSTRUCTION SURGERY  2005   baseball injury  . SHOULDER SURGERY  2004    Family Psychiatric History: Reviewed.  Family History:  Family History  Problem Relation Age of Onset  . Osteoarthritis Father   . Alzheimer's disease Paternal Grandmother   . Alzheimer's disease Maternal Aunt   . Heart disease Maternal Grandmother   . Diabetes Neg Hx   . Cancer Neg Hx   . Stroke Neg Hx     Social History:  Social History   Social History  . Marital status: Married    Spouse name: [name]  . Number of children: 4  . Years of education: college   Occupational History  . Sales    Social History Main Topics  . Smoking status: Never Smoker  . Smokeless tobacco: Current User    Types: Chew  . Alcohol use No  . Drug use: No  . Sexual activity: Not Asked   Other Topics Concern  . None   Social History Narrative  . None    Allergies: No Active Allergies  Metabolic Disorder Labs: Lab Results  Component Value Date   HGBA1C 5.5 04/07/2017   MPG 111 04/07/2017   No results found for: PROLACTIN No results found for: CHOL, TRIG, HDL, CHOLHDL, VLDL, LDLCALC Lab Results  Component Value Date   TSH 0.58 04/07/2017   TSH 0.83 01/08/2015    Therapeutic Level Labs: No results found for:  LITHIUM No results found for: VALPROATE No components found for:  CBMZ  Current Medications: Current Outpatient Prescriptions  Medication Sig Dispense Refill  . busPIRone (BUSPAR) 5 MG tablet Take 1 tablet (5 mg total) by mouth 2 (two) times daily. 60 tablet 1  . clonazePAM (KLONOPIN) 1 MG tablet Take 1 mg by mouth 2 (two) times daily. Takes 0.5mg  prn    . lamoTRIgine (LAMICTAL) 100 MG tablet Take 1 tablet (100 mg total) by mouth daily. 30 tablet 1  . sildenafil (REVATIO) 20 MG tablet Take 1-5 tablets (20-100 mg total) by mouth daily as needed. 50 tablet 0  . venlafaxine XR (EFFEXOR-XR) 150 MG 24 hr capsule Take 150 mg by mouth  daily.     No current facility-administered medications for this visit.      Musculoskeletal: Strength & Muscle Tone: within normal limits Gait & Station: normal Patient leans: N/A  Psychiatric Specialty Exam: Review of Systems  Constitutional: Negative.   HENT: Negative.   Respiratory: Negative.   Cardiovascular: Negative.   Musculoskeletal: Negative.   Skin: Negative for itching and rash.  Neurological: Negative.   Psychiatric/Behavioral: The patient is nervous/anxious.     Blood pressure 132/78, pulse 98, height 5' 9.5" (1.765 m), weight 166 lb (75.3 kg).Body mass index is 24.16 kg/m.  General Appearance: Casual  Eye Contact:  Fair  Speech:  Clear and Coherent  Volume:  Normal  Mood:  Anxious  Affect:  Constricted  Thought Process:  Goal Directed  Orientation:  Full (Time, Place, and Person)  Thought Content: Logical and Rumination   Suicidal Thoughts:  No  Homicidal Thoughts:  No  Memory:  Immediate;   Good Recent;   Good Remote;   Good  Judgement:  Good  Insight:  Good  Psychomotor Activity:  Normal  Concentration:  Concentration: Fair and Attention Span: Fair  Recall:  [place] of Knowledge: Good  Language: Good  Akathisia:  No  Handed:  Right  AIMS (if indicated): not done  Assets:  Communication Skills Desire for Improvement Financial Resources/Insurance Housing Resilience Social Support  ADL's:  Intact  Cognition: WNL  Sleep:  Fair   Screenings:   Assessment and Plan: Bipolar disorder, depressed type.  Anxiety disorder NOS.  I review his symptoms.  One more time I encouraged to see a therapist but patient declined.  He like to come off from Effexor because of sexual side effects.  I would reduce the dose Effexor 112.5 for 2 weeks and then 75 mg to avoid withdrawal symptoms.  Increase Lamictal 150 mg daily and also increase BuSpar 10 mg twice a day.  Discussed medication side effects and benefits.  Patient has no rash, itching, tremors or  shakes.  Follow-up in 4-6 weeks.  Discuss safety plan that anytime having active suicidal thoughts or homicidal thought that he need to call 911 or go to the local emergency room.  Time spent 25 minutes.   Sargon Scouten T., MD 06/22/2017, 8:21 AM

## 2017-08-03 ENCOUNTER — Encounter (HOSPITAL_COMMUNITY): Payer: Self-pay | Admitting: Psychiatry

## 2017-08-03 ENCOUNTER — Ambulatory Visit (INDEPENDENT_AMBULATORY_CARE_PROVIDER_SITE_OTHER): Payer: 59 | Admitting: Psychiatry

## 2017-08-03 DIAGNOSIS — F3130 Bipolar disorder, current episode depressed, mild or moderate severity, unspecified: Secondary | ICD-10-CM

## 2017-08-03 DIAGNOSIS — F419 Anxiety disorder, unspecified: Secondary | ICD-10-CM | POA: Diagnosis not present

## 2017-08-03 DIAGNOSIS — F17220 Nicotine dependence, chewing tobacco, uncomplicated: Secondary | ICD-10-CM

## 2017-08-03 DIAGNOSIS — Z810 Family history of intellectual disabilities: Secondary | ICD-10-CM

## 2017-08-03 MED ORDER — BUSPIRONE HCL 10 MG PO TABS: 10.0000 mg | ORAL_TABLET | Freq: Three times a day (TID) | ORAL | 1 refills | Status: DC

## 2017-08-03 MED ORDER — LAMOTRIGINE 150 MG PO TABS: 150.0000 mg | ORAL_TABLET | Freq: Every day | ORAL | 1 refills | Status: DC

## 2017-08-03 NOTE — Progress Notes (Signed)
[facility] MD/PA/NP OP Progress Note  08/03/2017 8:26 AM Jermaine Kidd  MRN:  [id number]  Chief Complaint: I'm feeling better.  I cut down my Effexor and only taking 37.5.  HPI: Patient came for his follow-up appointment.  He's taking Lamictal and BuSpar.  He admitted medicine helping his irritability, frustration and mood swings.  He has no rash, itching or tremors.  He is cutting down his Effexor and now only taking 37.5.  He endorse that she'll side effects since he reduced the Effexor he is feeling better.  He still take Klonopin 0.5 mg which helps his sleep.  He still feels sometimes anxious and nervous.patient denies any paranoia, hallucination, suicidal thoughts or homicidal thought.  His energy level is good.  He recently bought a house and he is working to fix the house and also working in the backyard.  He is currently separated but he has a good relationship with his wife.  Patient denies drinking alcohol or using any illegal substances.  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  Visit Diagnosis:    ICD-10-CM   1. Bipolar I disorder, most recent episode depressed (HCC) F31.30 busPIRone (BUSPAR) 10 MG tablet    lamoTRIgine (LAMICTAL) 150 MG tablet    Past Psychiatric History: Reviewed. Patient denies any history of psychiatric inpatient treatment or any suicidal attempt. He started seeing psychiatrist at [facility] but he was not happy with the treatment and then he started per Dr. Toy Cookey. He tried Prozac, Ambien, Cymbalta, Paxil, Wellbutrin, Lexapro, Seroquel and Klonopin. He reported sexual side effects with most of the antidepressant. He admitted history of irritability, impulsive behavior and depression.  Past Medical History:  Past Medical History:  Diagnosis Date  . Anxiety   . Anxiety disorder    onset age 36    Past Surgical History:  Procedure Laterality Date  . FACIAL RECONSTRUCTION SURGERY  2005   baseball injury  . SHOULDER SURGERY  2004    Family  Psychiatric History: reviewed.  Family History:  Family History  Problem Relation Age of Onset  . Osteoarthritis Father   . Alzheimer's disease Paternal Grandmother   . Alzheimer's disease Maternal Aunt   . Heart disease Maternal Grandmother   . Diabetes Neg Hx   . Cancer Neg Hx   . Stroke Neg Hx     Social History:  Social History   Social History  . Marital status: Married    Spouse name: [name]  . Number of children: 4  . Years of education: college   Occupational History  . Sales    Social History Main Topics  . Smoking status: Never Smoker  . Smokeless tobacco: Current User    Types: Chew  . Alcohol use No  . Drug use: No  . Sexual activity: Not Asked   Other Topics Concern  . None   Social History Narrative  . None    Allergies: No Active Allergies  Metabolic Disorder Labs: Lab Results  Component Value Date   HGBA1C 5.5 04/07/2017   MPG 111 04/07/2017   No results found for: PROLACTIN No results found for: CHOL, TRIG, HDL, CHOLHDL, VLDL, LDLCALC Lab Results  Component Value Date   TSH 0.58 04/07/2017   TSH 0.83 01/08/2015    Therapeutic Level Labs: No results found for: LITHIUM No results found for: VALPROATE No components found for:  CBMZ  Current Medications: Current Outpatient Prescriptions  Medication Sig Dispense Refill  . busPIRone (BUSPAR) 10 MG tablet Take 1  tablet (10 mg total) by mouth 3 (three) times daily. 90 tablet 1  . clonazePAM (KLONOPIN) 1 MG tablet Take 1 mg by mouth 2 (two) times daily. Takes 0.5mg  prn    . lamoTRIgine (LAMICTAL) 150 MG tablet Take 1 tablet (150 mg total) by mouth daily. 30 tablet 1   No current facility-administered medications for this visit.      Musculoskeletal: Strength & Muscle Tone: within normal limits Gait & Station: normal Patient leans: N/A  Psychiatric Specialty Exam: ROS  Blood pressure 122/74, pulse 72, height 5' 9.5" (1.765 m), weight 178 lb 12.8 oz (81.1 kg).Body mass index is  26.03 kg/m.  General Appearance: Casual  Eye Contact:  Fair  Speech:  Clear and Coherent  Volume:  Normal  Mood:  Euthymic  Affect:  Appropriate  Thought Process:  Coherent  Orientation:  Full (Time, Place, and Person)  Thought Content: Logical   Suicidal Thoughts:  No  Homicidal Thoughts:  No  Memory:  Immediate;   Good Recent;   Good Remote;   Good  Judgement:  Good  Insight:  Good  Psychomotor Activity:  Normal  Concentration:  Concentration: Good and Attention Span: Good  Recall:  Good  Fund of Knowledge: Good  Language: Good  Akathisia:  No  Handed:  Right  AIMS (if indicated): not done  Assets:  Communication Skills Desire for [place] Talents/Skills  ADL's:  Intact  Cognition: WNL  Sleep:  Good   Screenings:   Assessment and Plan: bipolar disorder, depressed type.  Anxiety disorder NOS.  Patient doing better on Lamictal.  He slowly and gradually coming off from Effexor due to sexual side effects.  He is taking Effexor 37.5 milligrams but he will stop next week.  I recommended to increase BuSpar 10 mg 3 times a day to help residual anxiety.  Continue Lamictal 150 mg daily.  He is getting Klonopin 0.5 mg from other provider and I recommended to take only as needed for severe anxiety.  Discussed medication side effects and benefits.  He has no rash, itching, tremors or shakes.  Patient is not interested in counseling.  Recommended to call us back if he has any question or any concern.  Follow-up in 2 months.   Sway Guttierrez T., MD 08/03/2017, 8:26 AM

## 2017-08-05 DIAGNOSIS — Z23 Encounter for immunization: Secondary | ICD-10-CM | POA: Diagnosis not present

## 2017-09-21 DIAGNOSIS — R51 Headache: Secondary | ICD-10-CM | POA: Diagnosis not present

## 2017-10-11 ENCOUNTER — Other Ambulatory Visit (HOSPITAL_COMMUNITY): Payer: Self-pay

## 2017-10-11 DIAGNOSIS — F3130 Bipolar disorder, current episode depressed, mild or moderate severity, unspecified: Secondary | ICD-10-CM

## 2017-10-11 MED ORDER — BUSPIRONE HCL 10 MG PO TABS: 10.0000 mg | ORAL_TABLET | Freq: Three times a day (TID) | ORAL | 0 refills | Status: DC

## 2017-10-11 MED ORDER — LAMOTRIGINE 150 MG PO TABS: 150.0000 mg | ORAL_TABLET | Freq: Every day | ORAL | 0 refills | Status: DC

## 2017-10-19 ENCOUNTER — Ambulatory Visit (INDEPENDENT_AMBULATORY_CARE_PROVIDER_SITE_OTHER): Payer: 59 | Admitting: Psychiatry

## 2017-10-19 ENCOUNTER — Encounter (HOSPITAL_COMMUNITY): Payer: Self-pay | Admitting: Psychiatry

## 2017-10-19 DIAGNOSIS — Z810 Family history of intellectual disabilities: Secondary | ICD-10-CM

## 2017-10-19 DIAGNOSIS — R450 Nervousness: Secondary | ICD-10-CM

## 2017-10-19 DIAGNOSIS — F419 Anxiety disorder, unspecified: Secondary | ICD-10-CM | POA: Diagnosis not present

## 2017-10-19 DIAGNOSIS — G4700 Insomnia, unspecified: Secondary | ICD-10-CM

## 2017-10-19 DIAGNOSIS — F3130 Bipolar disorder, current episode depressed, mild or moderate severity, unspecified: Secondary | ICD-10-CM

## 2017-10-19 MED ORDER — BUSPIRONE HCL 15 MG PO TABS: 15.0000 mg | ORAL_TABLET | Freq: Three times a day (TID) | ORAL | 1 refills | Status: DC

## 2017-10-19 MED ORDER — LAMOTRIGINE 200 MG PO TABS: 200.0000 mg | ORAL_TABLET | Freq: Every day | ORAL | 1 refills | Status: DC

## 2017-10-19 NOTE — Progress Notes (Signed)
[facility] MD/PA/NP OP Progress Note  10/19/2017 8:32 AM Jermaine Kidd  MRN:  [id number]  Chief Complaint: I am having mood swing and anger.  I am not sleeping good.  Anxiety is coming back.  HPI: Patient came for his follow-up appointment.  For the past few weeks he has noticed increased irritability, mood swings, anger and racing thoughts.  He is not sure what triggered the symptoms.  He admitted taking Klonopin almost every day which is prescribed by his previous physician.  He does not want to go back on Effexor due to sexual side effects.  He admitted some time frustrated and irritable for no reason.  He is trying to reconcile with his ex-wife but he believe she started drinking and did not have time for him.  Patient denies any hallucination, paranoia, suicidal thoughts or homicidal thought but admitted poor sleep, rumination, anxiety and short temper.  He has no rash, itching or tremors.  His energy level is good.  He denies drinking alcohol or using any illegal substances.  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  Patient is not interested in counseling.  His appetite is okay.  His vital signs are stable.  Visit Diagnosis:    ICD-10-CM   1. Bipolar I disorder, most recent episode depressed (HCC) F31.30 lamoTRIgine (LAMICTAL) 200 MG tablet    busPIRone (BUSPAR) 15 MG tablet    Past Psychiatric History: Reviewed. Patient denies any history of psychiatric inpatient treatment or any suicidal attempt. He started seeing psychiatrist at [facility] but he was not happy with the treatment and then he started per Dr. Toy Cookey. He tried Prozac, Ambien, Cymbalta, Paxil, Wellbutrin, Lexapro, Seroquel and Klonopin. He reported sexual side effects with most of the antidepressant. He admitted history of irritability, impulsive behavior and depression.  Past Medical History:  Past Medical History:  Diagnosis Date  . Anxiety   . Anxiety disorder    onset age 12    Past Surgical History:   Procedure Laterality Date  . FACIAL RECONSTRUCTION SURGERY  2005   baseball injury  . SHOULDER SURGERY  2004    Family Psychiatric History: Reviewed  Family History:  Family History  Problem Relation Age of Onset  . Osteoarthritis Father   . Alzheimer's disease Paternal Grandmother   . Alzheimer's disease Maternal Aunt   . Heart disease Maternal Grandmother   . Diabetes Neg Hx   . Cancer Neg Hx   . Stroke Neg Hx     Social History:  Social History   Socioeconomic History  . Marital status: Married    Spouse name: [name]  . Number of children: 4  . Years of education: college  . Highest education level: None  Social Needs  . Financial resource strain: None  . Food insecurity - worry: None  . Food insecurity - inability: None  . Transportation needs - medical: None  . Transportation needs - non-medical: None  Occupational History  . Occupation: Sales  Tobacco Use  . Smoking status: Never Smoker  . Smokeless tobacco: Current User    Types: Snuff  Substance and Sexual Activity  . Alcohol use: No  . Drug use: No  . Sexual activity: None  Other Topics Concern  . None  Social History Narrative  . None    Allergies: No Active Allergies  Metabolic Disorder Labs: Lab Results  Component Value Date   HGBA1C 5.5 04/07/2017   MPG 111 04/07/2017   No results found for: PROLACTIN No  results found for: CHOL, TRIG, HDL, CHOLHDL, VLDL, LDLCALC Lab Results  Component Value Date   TSH 0.58 04/07/2017   TSH 0.83 01/08/2015    Therapeutic Level Labs: No results found for: LITHIUM No results found for: VALPROATE No components found for:  CBMZ  Current Medications: Current Outpatient Medications  Medication Sig Dispense Refill  . busPIRone (BUSPAR) 10 MG tablet Take 1 tablet (10 mg total) by mouth 3 (three) times daily. 90 tablet 0  . clonazePAM (KLONOPIN) 1 MG tablet Take 1 mg by mouth 2 (two) times daily. Takes 0.5mg  prn    . lamoTRIgine (LAMICTAL) 150 MG  tablet Take 1 tablet (150 mg total) by mouth daily. 30 tablet 0  . venlafaxine XR (EFFEXOR-XR) 37.5 MG 24 hr capsule Take 37.5 mg by mouth daily.     No current facility-administered medications for this visit.      Musculoskeletal: Strength & Muscle Tone: within normal limits Gait & Station: normal Patient leans: N/A  Psychiatric Specialty Exam: Review of Systems  Constitutional: Negative.   HENT: Negative.   Cardiovascular: Negative.   Genitourinary: Negative.   Musculoskeletal: Negative.   Skin: Negative.  Negative for itching and rash.  Neurological: Negative for tremors.  Psychiatric/Behavioral: Negative for memory loss and suicidal ideas. The patient is nervous/anxious and has insomnia.     Blood pressure 128/78, pulse 95, height 5' 9.5" (1.765 m), weight 182 lb (82.6 kg).Body mass index is 26.49 kg/m.  General Appearance: Casual  Eye Contact:  Fair  Speech:  Slow  Volume:  Normal  Mood:  Anxious  Affect:  Constricted  Thought Process:  Goal Directed  Orientation:  Full (Time, Place, and Person)  Thought Content: Rumination   Suicidal Thoughts:  No  Homicidal Thoughts:  No  Memory:  Immediate;   Good Recent;   Good Remote;   Good  Judgement:  Good  Insight:  Good  Psychomotor Activity:  Normal  Concentration:  Concentration: Good and Attention Span: Good  Recall:  Good  Fund of Knowledge: Good  Language: Good  Akathisia:  No  Handed:  Right  AIMS (if indicated): not done  Assets:  Communication Skills Desire for Improvement Housing Resilience  ADL's:  Intact  Cognition: WNL  Sleep:  Fair   Screenings:   Assessment and Plan: Bipolar disorder, depressed type.  Anxiety disorder NOS.  Patient started to have more anxiety irritability and mood swings.  He admitted taking Klonopin more than usual which is prescribed by his previous physician.  I recommended to discontinue Klonopin since he already taking BuSpar, Effexor and Lamictal.  He does not want to  increase his Effexor dose due to sexual side effects.  I recommended to try BuSpar 15 mg 3 times a day and Lamictal 200 mg daily.  He will continue Effexor 37.5 mg daily.  One more time I encouraged to see a therapist but patient declined.  He has no rash, itching, tremors or shakes.  If patient do not see any improvement we will consider switching to Latuda or Abilify.  Discussed healthy lifestyle and encourage to watch his calorie intake and do regular exercise.  Follow-up in 6 weeks.  Time spent 25 minutes.  More than 50% of the time spent in psychoeducation, counseling, coordination of care and dealing with his psychosocial stressors.   Kathlee Nations, MD 10/19/2017, 8:32 AM

## 2017-11-15 ENCOUNTER — Other Ambulatory Visit (HOSPITAL_COMMUNITY): Payer: Self-pay | Admitting: Psychiatry

## 2017-11-15 DIAGNOSIS — F3130 Bipolar disorder, current episode depressed, mild or moderate severity, unspecified: Secondary | ICD-10-CM

## 2017-11-29 ENCOUNTER — Telehealth (HOSPITAL_COMMUNITY): Payer: Self-pay

## 2017-11-29 NOTE — Telephone Encounter (Signed)
Jermaine Kidd called requesting a refill on Effexor. Patient has an [facility] appointment on 12/02/17 but they are only  requesting enough tablets until his next appointment. Please advise.

## 2017-11-30 MED ORDER — VENLAFAXINE HCL ER 37.5 MG PO CP24: 37.5000 mg | ORAL_CAPSULE | Freq: Every day | ORAL | 0 refills | Status: DC

## 2017-11-30 NOTE — Telephone Encounter (Signed)
Ok to refill until his next appointment

## 2017-11-30 NOTE — Telephone Encounter (Signed)
Sent in 7 days of Effexor, patient has an appointment on 12/02/2017

## 2017-12-02 ENCOUNTER — Encounter (HOSPITAL_COMMUNITY): Payer: Self-pay | Admitting: Psychiatry

## 2017-12-02 ENCOUNTER — Ambulatory Visit (INDEPENDENT_AMBULATORY_CARE_PROVIDER_SITE_OTHER): Payer: 59 | Admitting: Psychiatry

## 2017-12-02 VITALS — BP 121/72 | HR 84 | Ht 68.2 in | Wt 181.4 lb

## 2017-12-02 DIAGNOSIS — F3130 Bipolar disorder, current episode depressed, mild or moderate severity, unspecified: Secondary | ICD-10-CM | POA: Diagnosis not present

## 2017-12-02 DIAGNOSIS — F419 Anxiety disorder, unspecified: Secondary | ICD-10-CM

## 2017-12-02 DIAGNOSIS — Z810 Family history of intellectual disabilities: Secondary | ICD-10-CM

## 2017-12-02 MED ORDER — LAMOTRIGINE 200 MG PO TABS: 200.0000 mg | ORAL_TABLET | Freq: Every day | ORAL | 1 refills | Status: DC

## 2017-12-02 MED ORDER — BUSPIRONE HCL 10 MG PO TABS: 10.0000 mg | ORAL_TABLET | Freq: Three times a day (TID) | ORAL | 1 refills | Status: DC

## 2017-12-02 MED ORDER — VENLAFAXINE HCL ER 37.5 MG PO CP24: 37.5000 mg | ORAL_CAPSULE | Freq: Every day | ORAL | 0 refills | Status: DC

## 2017-12-02 NOTE — Progress Notes (Signed)
[facility] MD/PA/NP OP Progress Note  12/02/2017 8:33 AM CORT DRAGOO  MRN:  [id number]  Chief Complaint: I cannot tolerate higher dose of BuSpar.  I am sleeping too much.  HPI: Patient came for his follow-up appointment.  On his last visit we increase BuSpar 15 mg 3 times a day but he did not tolerate very well and complain of sleeping too much.  He cut down his BuSpar to 10 mg 3 times a day.  Overall he describes his mood is okay but he still gets some time irritability and frustration but denies any mania, psychosis or any hallucination.  He takes Klonopin 0.5 mg from other provider to help his sleep.  He continues to trying to get reconciliation with his wife but he believe his wife drinking and did not have time for him.  Patient denies any anger, agitation or any suicidal thoughts.  His energy level is good.  He denies drinking alcohol or using any illegal substances.  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  He has no rash, itching, tremors or shakes.  Has any major panic attack.  His vital signs are stable.  Visit Diagnosis:    ICD-10-CM   1. Bipolar I disorder, most recent episode depressed (HCC) F31.30 venlafaxine XR (EFFEXOR-XR) 37.5 MG 24 hr capsule    busPIRone (BUSPAR) 10 MG tablet    lamoTRIgine (LAMICTAL) 200 MG tablet    Past Psychiatric History: Viewed. Patient denies any history of psychiatric inpatient treatment or any suicidal attempt. He started seeing psychiatrist at [facility] but he was not happy with the treatment and then he started per Dr. Toy Cookey. He tried Prozac, Ambien, Cymbalta, Paxil, Wellbutrin, Lexapro, Seroquel and Klonopin. He reported sexual side effects with most of the antidepressant. He admitted history of irritability, impulsive behavior and depression.  Past Medical History:  Past Medical History:  Diagnosis Date  . Anxiety   . Anxiety disorder    onset age 11    Past Surgical History:  Procedure Laterality Date   . FACIAL RECONSTRUCTION SURGERY  2005   baseball injury  . SHOULDER SURGERY  2004    Family Psychiatric History: Updated.  Family History:  Family History  Problem Relation Age of Onset  . Osteoarthritis Father   . Alzheimer's disease Paternal Grandmother   . Alzheimer's disease Maternal Aunt   . Heart disease Maternal Grandmother   . Diabetes Neg Hx   . Cancer Neg Hx   . Stroke Neg Hx     Social History:  Social History   Socioeconomic History  . Marital status: Married    Spouse name: [name]  . Number of children: 4  . Years of education: college  . Highest education level: None  Social Needs  . Financial resource strain: None  . Food insecurity - worry: None  . Food insecurity - inability: None  . Transportation needs - medical: None  . Transportation needs - non-medical: None  Occupational History  . Occupation: Sales  Tobacco Use  . Smoking status: Never Smoker  . Smokeless tobacco: Current User    Types: Snuff  Substance and Sexual Activity  . Alcohol use: No  . Drug use: No  . Sexual activity: None  Other Topics Concern  . None  Social History Narrative  . None    Allergies: No Active Allergies  Metabolic Disorder Labs: Lab Results  Component Value Date   HGBA1C 5.5 04/07/2017   MPG 111  04/07/2017   No results found for: PROLACTIN No results found for: CHOL, TRIG, HDL, CHOLHDL, VLDL, LDLCALC Lab Results  Component Value Date   TSH 0.58 04/07/2017   TSH 0.83 01/08/2015    Therapeutic Level Labs: No results found for: LITHIUM No results found for: VALPROATE No components found for:  CBMZ  Current Medications: Current Outpatient Medications  Medication Sig Dispense Refill  . busPIRone (BUSPAR) 15 MG tablet Take 1 tablet (15 mg total) by mouth 3 (three) times daily. 90 tablet 1  . clonazePAM (KLONOPIN) 1 MG tablet Take 1 mg by mouth 2 (two) times daily. Takes 0.5mg  prn    . lamoTRIgine (LAMICTAL) 200 MG tablet Take 1 tablet (200 mg  total) by mouth daily. 30 tablet 1  . venlafaxine XR (EFFEXOR-XR) 37.5 MG 24 hr capsule Take 1 capsule (37.5 mg total) by mouth daily. 7 capsule 0   No current facility-administered medications for this visit.      Musculoskeletal: Strength & Muscle Tone: within normal limits Gait & Station: normal Patient leans: N/A  Psychiatric Specialty Exam: Review of Systems  Skin: Negative.  Negative for itching and rash.    Blood pressure 121/72, pulse 84, height 5' 8.2" (1.732 m), weight 181 lb 6.4 oz (82.3 kg).Body mass index is 27.42 kg/m.  General Appearance: Casual  Eye Contact:  Good  Speech:  Slow  Volume:  Normal  Mood:  Euthymic  Affect:  Appropriate  Thought Process:  Goal Directed  Orientation:  Full (Time, Place, and Person)  Thought Content: Logical   Suicidal Thoughts:  No  Homicidal Thoughts:  No  Memory:  Immediate;   Good Recent;   Good Remote;   Good  Judgement:  Good  Insight:  Good  Psychomotor Activity:  Normal  Concentration:  Concentration: Good and Attention Span: Good  Recall:  Good  Fund of Knowledge: Good  Language: Good  Akathisia:  No  Handed:  Right  AIMS (if indicated): not done  Assets:  Communication Skills Desire for Improvement Housing Resilience Social Support  ADL's:  Intact  Cognition: WNL  Sleep:  Good   Screenings:   Assessment and Plan: Bipolar disorder, depressed type.  Anxiety disorder NOS.  Patient cannot tolerate higher dose of BuSpar.  We will reduce the dose to BuSpar 10 mg 3 times a day.  Patient overall feels medicine working.  I will continue Lamictal 200 mg daily.  Has no rash, itching or tremors.  He is taking Effexor 37.5 mg however we discussed about the dosage of the Effexor but patient is care to stop the medication because he feared that his anxiety will come back.  I recommend to try to stop the Effexor and his anxiety comes back then either he can go back to Effexor or maybe we can increase the Lamictal dose.   Patient is not interested in counseling.  Recommended to call us back if he has any question or any concern.  Follow-up in 3 months.   Kathlee Nations, MD 12/02/2017, 8:33 AM

## 2017-12-08 DIAGNOSIS — M7542 Impingement syndrome of left shoulder: Secondary | ICD-10-CM | POA: Diagnosis not present

## 2018-01-09 ENCOUNTER — Other Ambulatory Visit (HOSPITAL_COMMUNITY): Payer: Self-pay | Admitting: Psychiatry

## 2018-01-09 DIAGNOSIS — F3130 Bipolar disorder, current episode depressed, mild or moderate severity, unspecified: Secondary | ICD-10-CM

## 2018-01-10 ENCOUNTER — Other Ambulatory Visit (HOSPITAL_COMMUNITY): Payer: Self-pay | Admitting: Psychiatry

## 2018-02-01 ENCOUNTER — Ambulatory Visit (HOSPITAL_COMMUNITY): Payer: 59 | Admitting: Psychiatry

## 2018-02-03 ENCOUNTER — Ambulatory Visit: Payer: Self-pay | Admitting: Nurse Practitioner

## 2018-02-04 ENCOUNTER — Ambulatory Visit: Payer: 59 | Admitting: Family

## 2018-02-04 ENCOUNTER — Encounter: Payer: Self-pay | Admitting: Family

## 2018-02-04 VITALS — BP 122/82 | HR 95 | Temp 97.8°F | Ht 68.2 in | Wt 179.1 lb

## 2018-02-04 DIAGNOSIS — Z0000 Encounter for general adult medical examination without abnormal findings: Secondary | ICD-10-CM

## 2018-02-04 DIAGNOSIS — Z1211 Encounter for screening for malignant neoplasm of colon: Secondary | ICD-10-CM

## 2018-02-04 DIAGNOSIS — N529 Male erectile dysfunction, unspecified: Secondary | ICD-10-CM | POA: Diagnosis not present

## 2018-02-04 DIAGNOSIS — Z125 Encounter for screening for malignant neoplasm of prostate: Secondary | ICD-10-CM | POA: Diagnosis not present

## 2018-02-04 DIAGNOSIS — Z13220 Encounter for screening for lipoid disorders: Secondary | ICD-10-CM | POA: Diagnosis not present

## 2018-02-04 NOTE — Progress Notes (Signed)
Jermaine Kidd is a 52 y.o. male with the following history as recorded in [employer]:  Patient Active Problem List   Diagnosis Date Noted  . Erectile dysfunction 06/23/2016  . Hyperglycemia 01/08/2015  . Insomnia secondary to situational depression 12/12/2014  . Bipolar disorder ([place]) 07/01/2014  . Low serum testosterone level 03/01/2014  . Anxiety disorder 07/21/2012    Current Outpatient Medications  Medication Sig Dispense Refill  . busPIRone (BUSPAR) 10 MG tablet Take 1 tablet (10 mg total) by mouth 3 (three) times daily. 90 tablet 1  . clonazePAM (KLONOPIN) 1 MG tablet Take 1 mg by mouth 2 (two) times daily. Takes 0.60m prn    . lamoTRIgine (LAMICTAL) 200 MG tablet Take 1 tablet (200 mg total) by mouth daily. 30 tablet 1  . venlafaxine XR (EFFEXOR-XR) 37.5 MG 24 hr capsule TAKE 1 CAPSULE(37.5 MG) BY MOUTH DAILY 30 capsule 0   No current facility-administered medications for this visit.     Allergies: Patient has no active allergies.  Past Medical History:  Diagnosis Date  . Anxiety   . Anxiety disorder    onset age 52   Past Surgical History:  Procedure Laterality Date  . FACIAL RECONSTRUCTION SURGERY  2005   baseball injury  . SHOULDER SURGERY  2004    Family History  Problem Relation Age of Onset  . Osteoarthritis Father   . Alzheimer's disease Paternal Grandmother   . Alzheimer's disease Maternal Aunt   . Heart disease Maternal Grandmother   . Diabetes Neg Hx   . Cancer Neg Hx   . Stroke Neg Hx     Social History   Tobacco Use  . Smoking status: Never Smoker  . Smokeless tobacco: Current User    Types: Snuff  Substance Use Topics  . Alcohol use: No    Subjective:  Patient presents with concerns for yearly CPE; in baseline of state of health; will be seeing [facility] on May 11 with concerns for low testosterone; knows he is due for colonoscopy- prefers to discuss other options; exercises at least 4-5 days per week; no concerns with sleep; up to date  on dental and eye exam;  Tdap- defers today- agrees to update in the next year; PSA- lab updated; keep planned follow-up with urology Colonoscopy- order for Cologuard done;   Objective:  Vitals:   02/04/18 1028  BP: 122/82  Pulse: 95  Temp: 97.8 F (36.6 C)  TempSrc: Oral  SpO2: 93%  Weight: 179 lb 1.9 oz (81.2 kg)  Height: 5' 8.2" (1.732 m)    General: Well developed, well nourished, in no acute distress  Skin : Warm and dry.  Head: Normocephalic and atraumatic  Eyes: Sclera and conjunctiva clear; pupils round and reactive to light; extraocular movements intact  Ears: External normal; canals clear; tympanic membranes normal  Oropharynx: Pink, supple. No suspicious lesions  Neck: Supple without thyromegaly, adenopathy  Lungs: Respirations unlabored; clear to auscultation bilaterally without wheeze, rales, rhonchi  CVS exam: normal rate and regular rhythm.  Abdomen: Soft; nontender; nondistended; normoactive bowel sounds; no masses or hepatosplenomegaly  Musculoskeletal: No deformities; no active joint inflammation  Extremities: No edema, cyanosis, clubbing  Vessels: Symmetric bilaterally  Neurologic: Alert and oriented; speech intact; face symmetrical; moves all extremities well; CNII-XII intact without focal deficit  Assessment:  1. PE (physical exam), annual   2. Colon cancer screening   3. Lipid screening   4. Prostate cancer screening   5. Erectile dysfunction, unspecified erectile dysfunction type  Plan:   Congratulated patient on commitment to health; return for fasting labs at his convenience; continue regular dental/ vision exams; Tdap to be done within the next year- he thinks his was done in 2008; Cologuard order placed; follow-up in 1 year, sooner prn.   No follow-ups on file.  Orders Placed This Encounter  Procedures  . Tdap vaccine greater than or equal to 7yo IM    Standing Status:   Future    Standing Expiration Date:   02/05/2019  . Cologuard  .  CBC w/Diff    Standing Status:   Future    Standing Expiration Date:   02/04/2019  . Comp Met (CMET)    Standing Status:   Future    Standing Expiration Date:   02/04/2019  . Lipid panel    Standing Status:   Future    Standing Expiration Date:   02/05/2019  . TSH    Standing Status:   Future    Standing Expiration Date:   02/04/2019  . PSA    Standing Status:   Future    Standing Expiration Date:   02/04/2019  . Testosterone, Free, Total, SHBG    Standing Status:   Future    Standing Expiration Date:   02/04/2019    Requested Prescriptions    No prescriptions requested or ordered in this encounter

## 2018-02-04 NOTE — Patient Instructions (Signed)
Follow- up for your labs at your convenience; nice to meet you today;

## 2018-02-08 ENCOUNTER — Ambulatory Visit (HOSPITAL_COMMUNITY): Payer: 59 | Admitting: Psychiatry

## 2018-02-08 ENCOUNTER — Other Ambulatory Visit (HOSPITAL_COMMUNITY): Payer: Self-pay | Admitting: Psychiatry

## 2018-02-08 DIAGNOSIS — F3130 Bipolar disorder, current episode depressed, mild or moderate severity, unspecified: Secondary | ICD-10-CM

## 2018-02-10 ENCOUNTER — Encounter (HOSPITAL_COMMUNITY): Payer: Self-pay | Admitting: Psychiatry

## 2018-02-10 ENCOUNTER — Ambulatory Visit (HOSPITAL_COMMUNITY): Payer: 59 | Admitting: Psychiatry

## 2018-02-10 ENCOUNTER — Ambulatory Visit (INDEPENDENT_AMBULATORY_CARE_PROVIDER_SITE_OTHER): Payer: 59 | Admitting: Psychiatry

## 2018-02-10 VITALS — BP 124/76 | HR 63 | Ht 68.75 in | Wt 178.0 lb

## 2018-02-10 DIAGNOSIS — F17220 Nicotine dependence, chewing tobacco, uncomplicated: Secondary | ICD-10-CM

## 2018-02-10 DIAGNOSIS — Z79899 Other long term (current) drug therapy: Secondary | ICD-10-CM | POA: Diagnosis not present

## 2018-02-10 DIAGNOSIS — F419 Anxiety disorder, unspecified: Secondary | ICD-10-CM | POA: Diagnosis not present

## 2018-02-10 DIAGNOSIS — F3130 Bipolar disorder, current episode depressed, mild or moderate severity, unspecified: Secondary | ICD-10-CM | POA: Diagnosis not present

## 2018-02-10 DIAGNOSIS — Z818 Family history of other mental and behavioral disorders: Secondary | ICD-10-CM

## 2018-02-10 MED ORDER — BUSPIRONE HCL 10 MG PO TABS: 10.0000 mg | ORAL_TABLET | Freq: Three times a day (TID) | ORAL | 2 refills | Status: DC

## 2018-02-10 MED ORDER — LAMOTRIGINE 200 MG PO TABS: 200.0000 mg | ORAL_TABLET | Freq: Every day | ORAL | 2 refills | Status: DC

## 2018-02-10 MED ORDER — CLONAZEPAM 0.5 MG PO TABS: 0.5000 mg | ORAL_TABLET | Freq: Every day | ORAL | 2 refills | Status: DC | PRN

## 2018-02-10 NOTE — Progress Notes (Signed)
[facility] [place] MD/PA/NP OP Progress Note  02/10/2018 1:25 PM Jermaine Kidd  MRN:  [id number]  Chief Complaint: I am doing better.  I am helping my anxiety much better than before.  HPI: Patient came for his follow-up appointment.  Since taking BuSpar 10 mg 3 times a day his anxiety is better and he is tolerating much better his dose.  He could not tolerate higher dose of BuSpar.  However he takes Klonopin 0.5 mg at bedtime for sleep.  He tried to come off from Effexor but he has withdrawal symptoms.  He think Lamictal working.  He denies any irritability, anger, mania or any psychosis.  He denies any agitation or any irritability.  Recently he took 15 kids to the beach.  It was a family reunion.  He has a nephew and niece and he did handle all of them very well.  He has no rash, itching, tremors.  He denies drinking or using any illegal substances.  His energy level is good.  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  She recently establish care with a primary physician.  He is scheduled to have blood work in few days.  Visit Diagnosis:    ICD-10-CM   1. Bipolar I disorder, most recent episode depressed (HCC) F31.30 clonazePAM (KLONOPIN) 0.5 MG tablet    lamoTRIgine (LAMICTAL) 200 MG tablet    busPIRone (BUSPAR) 10 MG tablet    Past Psychiatric History: Reviewed Patient denies any history of psychiatric inpatient treatment or any suicidal attempt. He started seeing psychiatrist at [facility] but he was not happy with the treatment and then he started per Dr. Toy Cookey. He tried Prozac, Ambien, Cymbalta, Paxil, Wellbutrin, Lexapro, Seroquel and Klonopin. He reported sexual side effects with most of the antidepressant. He admitted history of irritability, impulsive behavior and depression.  Past Medical History:  Past Medical History:  Diagnosis Date  . Anxiety   . Anxiety disorder    onset age 61    Past Surgical History:  Procedure Laterality Date  . FACIAL  RECONSTRUCTION SURGERY  2005   baseball injury  . SHOULDER SURGERY  2004    Family Psychiatric History: Reviewed  Family History:  Family History  Problem Relation Age of Onset  . Osteoarthritis Father   . Alzheimer's disease Paternal Grandmother   . Alzheimer's disease Maternal Aunt   . Heart disease Maternal Grandmother   . Diabetes Neg Hx   . Cancer Neg Hx   . Stroke Neg Hx     Social History:  Social History   Socioeconomic History  . Marital status: Married    Spouse name: [name]  . Number of children: 4  . Years of education: college  . Highest education level: Not on file  Occupational History  . Occupation: Press photographer  Social Needs  . Financial resource strain: Not on file  . Food insecurity:    Worry: Not on file    Inability: Not on file  . Transportation needs:    Medical: Not on file    Non-medical: Not on file  Tobacco Use  . Smoking status: Never Smoker  . Smokeless tobacco: Current User    Types: Snuff  Substance and Sexual Activity  . Alcohol use: No  . Drug use: No  . Sexual activity: Yes    Partners: Female    Birth control/protection: None  Lifestyle  . Physical activity:    Days per week: Not on file  Minutes per session: Not on file  . Stress: Not on file  Relationships  . Social connections:    Talks on phone: Not on file    Gets together: Not on file    Attends religious service: Not on file    Active member of club or organization: Not on file    Attends meetings of clubs or organizations: Not on file    Relationship status: Not on file  Other Topics Concern  . Not on file  Social History Narrative  . Not on file    Allergies: No Active Allergies  Metabolic Disorder Labs: Lab Results  Component Value Date   HGBA1C 5.5 04/07/2017   MPG 111 04/07/2017   No results found for: PROLACTIN No results found for: CHOL, TRIG, HDL, CHOLHDL, VLDL, LDLCALC Lab Results  Component Value Date   TSH 0.58 04/07/2017   TSH 0.83  01/08/2015    Therapeutic Level Labs: No results found for: LITHIUM No results found for: VALPROATE No components found for:  CBMZ  Current Medications: Current Outpatient Medications  Medication Sig Dispense Refill  . busPIRone (BUSPAR) 10 MG tablet Take 1 tablet (10 mg total) by mouth 3 (three) times daily. 90 tablet 2  . clonazePAM (KLONOPIN) 0.5 MG tablet Take 1 tablet (0.5 mg total) by mouth daily as needed for anxiety. 30 tablet 2  . lamoTRIgine (LAMICTAL) 200 MG tablet Take 1 tablet (200 mg total) by mouth daily. 30 tablet 2   No current facility-administered medications for this visit.      Musculoskeletal: Strength & Muscle Tone: within normal limits Gait & Station: normal Patient leans: N/A  Psychiatric Specialty Exam: ROS  Blood pressure 124/76, pulse 63, height 5' 8.75" (1.746 m), weight 178 lb (80.7 kg), SpO2 96 %.Body mass index is 26.48 kg/m.  General Appearance: Casual  Eye Contact:  Good  Speech:  Clear and Coherent  Volume:  Normal  Mood:  Euthymic  Affect:  Appropriate  Thought Process:  Goal Directed  Orientation:  Full (Time, Place, and Person)  Thought Content: Logical   Suicidal Thoughts:  No  Homicidal Thoughts:  No  Memory:  Immediate;   Good Recent;   Good Remote;   Good  Judgement:  Good  Insight:  Good  Psychomotor Activity:  Normal  Concentration:  Concentration: Good and Attention Span: Good  Recall:  Good  Fund of Knowledge: Good  Language: Good  Akathisia:  No  Handed:  Right  AIMS (if indicated): not done  Assets:  Communication Skills Desire for Improvement Financial Resources/Insurance Housing Physical Health Resilience Social Support  ADL's:  Intact  Cognition: WNL  Sleep:  Good   Screenings: PHQ2-9     Office Visit from 02/04/2018 in [facility]  PHQ-2 Total Score  0       Assessment and Plan: Bipolar disorder, depressed type.  Anxiety disorder NOS.  Patient doing better on BuSpar 10  mg 3 times a day, Lamictal 200 mg daily and Klonopin 0.5 mg as needed for anxiety.  I recommended to discontinue Effexor.  He is already taking low-dose.  I recommended to take every other day for 1 week and then to stop to avoid withdrawal symptoms.  Patient is not interested in counseling.  Recommended to call us back if he has any question or any concern.  Follow-up in 3 months.   Kathlee Nations, MD 02/10/2018, 1:25 PM

## 2018-02-18 DIAGNOSIS — E291 Testicular hypofunction: Secondary | ICD-10-CM | POA: Diagnosis not present

## 2018-02-24 DIAGNOSIS — E291 Testicular hypofunction: Secondary | ICD-10-CM | POA: Diagnosis not present

## 2018-02-25 ENCOUNTER — Encounter: Payer: Self-pay | Admitting: Family

## 2018-04-15 ENCOUNTER — Other Ambulatory Visit (HOSPITAL_COMMUNITY): Payer: Self-pay | Admitting: Psychiatry

## 2018-04-15 DIAGNOSIS — F3130 Bipolar disorder, current episode depressed, mild or moderate severity, unspecified: Secondary | ICD-10-CM

## 2018-05-03 LAB — COLOGUARD

## 2018-05-06 ENCOUNTER — Other Ambulatory Visit (HOSPITAL_COMMUNITY): Payer: Self-pay | Admitting: Psychiatry

## 2018-05-06 DIAGNOSIS — F3130 Bipolar disorder, current episode depressed, mild or moderate severity, unspecified: Secondary | ICD-10-CM

## 2018-05-11 ENCOUNTER — Other Ambulatory Visit (HOSPITAL_COMMUNITY): Payer: Self-pay

## 2018-05-11 ENCOUNTER — Other Ambulatory Visit (HOSPITAL_COMMUNITY): Payer: Self-pay | Admitting: Psychiatry

## 2018-05-11 DIAGNOSIS — F3130 Bipolar disorder, current episode depressed, mild or moderate severity, unspecified: Secondary | ICD-10-CM

## 2018-05-11 MED ORDER — CLONAZEPAM 0.5 MG PO TABS: 0.5000 mg | ORAL_TABLET | Freq: Every day | ORAL | 1 refills | Status: DC | PRN

## 2018-05-11 MED ORDER — BUSPIRONE HCL 10 MG PO TABS: 10.0000 mg | ORAL_TABLET | Freq: Three times a day (TID) | ORAL | 1 refills | Status: DC

## 2018-05-11 MED ORDER — LAMOTRIGINE 200 MG PO TABS: 200.0000 mg | ORAL_TABLET | Freq: Every day | ORAL | 1 refills | Status: DC

## 2018-05-16 ENCOUNTER — Ambulatory Visit (HOSPITAL_COMMUNITY): Payer: 59 | Admitting: Psychiatry

## 2018-06-24 ENCOUNTER — Encounter: Payer: Self-pay | Admitting: Family

## 2018-06-24 NOTE — Progress Notes (Signed)
Cologuard could not be processed.

## 2018-06-29 ENCOUNTER — Encounter (HOSPITAL_COMMUNITY): Payer: Self-pay | Admitting: Psychiatry

## 2018-06-29 ENCOUNTER — Ambulatory Visit (HOSPITAL_COMMUNITY): Payer: 59 | Admitting: Psychiatry

## 2018-06-29 VITALS — BP 128/74 | Ht 70.0 in | Wt 178.0 lb

## 2018-06-29 DIAGNOSIS — F3181 Bipolar II disorder: Secondary | ICD-10-CM | POA: Diagnosis not present

## 2018-06-29 DIAGNOSIS — Z79899 Other long term (current) drug therapy: Secondary | ICD-10-CM

## 2018-06-29 DIAGNOSIS — F4321 Adjustment disorder with depressed mood: Secondary | ICD-10-CM

## 2018-06-29 DIAGNOSIS — F411 Generalized anxiety disorder: Secondary | ICD-10-CM | POA: Diagnosis not present

## 2018-06-29 DIAGNOSIS — F5105 Insomnia due to other mental disorder: Secondary | ICD-10-CM

## 2018-06-29 MED ORDER — CLONAZEPAM 0.5 MG PO TABS: 0.5000 mg | ORAL_TABLET | Freq: Two times a day (BID) | ORAL | 2 refills | Status: DC

## 2018-06-29 MED ORDER — FLUOXETINE HCL 20 MG PO CAPS: 20.0000 mg | ORAL_CAPSULE | Freq: Every day | ORAL | 0 refills | Status: DC

## 2018-06-29 MED ORDER — LAMOTRIGINE 200 MG PO TABS: 200.0000 mg | ORAL_TABLET | Freq: Every day | ORAL | 0 refills | Status: DC

## 2018-06-29 NOTE — Progress Notes (Signed)
[facility] MD/PA/NP OP Progress Note  06/29/2018 11:05 AM Jermaine Kidd  MRN:  [id number]  Chief Complaint: The medicine is not working.  I need something else.    HPI: Patient came for his follow-up appointment. At last visit the following recommendations were made: BuSpar 15 mg 3 times a day, Lamictal 200 mg daily and Klonopin 0.5 mg as needed for anxiety.  I recommended to discontinue Effexor.  He is already taking low-dose.  I recommended to take every other day for 1 week and then to stop to avoid withdrawal symptoms.   Since his last visit, he states that he felt more anxious at the higher dose of Buspar and went back to TID, however he has times that he misses the 2nd or 3rd dose.  He notices no change in anxiety and irritability with or without it.  He denies that the Lamictal has helped.  Patient describes that people around him keep asking him what is wrong?  He reports that he has decreased energy, decreased focus at work and with his children, increased somatic complaints, poor sleep, increased anxiety and feels grouchy.  He describes, "I feel like a wooden statue, I feel numb, I don't want to do anything and have no empathy".  He states that he normally enjoys going to the beach with his family, but was disinterested and did not have any joy on a recent trip.  He reports thought ruminations of feeling guilty about "needing to be there for my son, and then getting irritated with him at the same time".  He feels bad, because his son wants to help him.  He states his "obsessional thoughts can occur at any time, and then he can't do anything else."  He has not been agreeable to therapy in the past, but is willing to consider it.    Patient lives with 93 year old daughter and 53 year old son and wife of 3 years who is supportive.   He denies any specific phobias, or panic attacks. He denies any crying spells or any feeling of hopelessness or worthlessness. He denies mania or hypomania with racing  thoughts, pressured speech, hyperactivity, grandiosity, distractibility, or changes in sleep or appetite. He denies SI, HI, self harm thoughts or AVH. He has no rash, itching, tremors.  He denies drinking or using any illegal substances.   Visit Diagnosis:  No diagnosis found.  Past Psychiatric History: Reviewed Patient denies any history of psychiatric inpatient treatment or any suicidal attempt. He started seeing psychiatrist at [facility] but he was not happy with the treatment and then he started per Dr. Toy Cookey. He tried Prozac, Ambien, Cymbalta, Paxil, Wellbutrin, Lexapro, Seroquel and Klonopin. He reported sexual side effects with most of the antidepressant. He admitted history of irritability, impulsive behavior and depression.  Past Medical History:  Past Medical History:  Diagnosis Date  . Anxiety   . Anxiety disorder    onset age 3    Past Surgical History:  Procedure Laterality Date  . FACIAL RECONSTRUCTION SURGERY  2005   baseball injury  . SHOULDER SURGERY  2004    Family Psychiatric History: Reviewed  Family History:  Family History  Problem Relation Age of Onset  . Osteoarthritis Father   . Alzheimer's disease Paternal Grandmother   . Alzheimer's disease Maternal Aunt   . Heart disease Maternal Grandmother   . Depression Son   . Diabetes Neg Hx   . Cancer Neg Hx   . Stroke Neg Hx  Social History:  Patient is divorced from first wife- he was given sole and legal custody of his children, but 32 year old daughter chooses to spend 1/2 time with her mother.  60 year old daughter will start [employer] to study vet medicine next year 110 year old son with depression lives home.  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 Social History   Socioeconomic History  . Marital status: Married    Spouse name: [name]  . Number of children: 4  .  Years of education: college  . Highest education level: Not on file  Occupational History  . Occupation: Press photographer  Social Needs  . Financial resource strain: Not on file  . Food insecurity:    Worry: Not on file    Inability: Not on file  . Transportation needs:    Medical: Not on file    Non-medical: Not on file  Tobacco Use  . Smoking status: Never Smoker  . Smokeless tobacco: Current User    Types: Snuff  Substance and Sexual Activity  . Alcohol use: No  . Drug use: No  . Sexual activity: Yes    Partners: Female    Birth control/protection: None  Lifestyle  . Physical activity:    Days per week: Not on file    Minutes per session: Not on file  . Stress: Not on file  Relationships  . Social connections:    Talks on phone: Not on file    Gets together: Not on file    Attends religious service: Not on file    Active member of club or organization: Not on file    Attends meetings of clubs or organizations: Not on file    Relationship status: Not on file  Other Topics Concern  . Not on file  Social History Narrative  . Not on file    Allergies: No Known Allergies  Metabolic Disorder Labs: Lab Results  Component Value Date   HGBA1C 5.5 04/07/2017   MPG 111 04/07/2017   No results found for: PROLACTIN No results found for: CHOL, TRIG, HDL, CHOLHDL, VLDL, LDLCALC Lab Results  Component Value Date   TSH 0.58 04/07/2017   TSH 0.83 01/08/2015    Therapeutic Level Labs: No results found for: LITHIUM No results found for: VALPROATE No components found for:  CBMZ  Current Medications: Current Outpatient Medications  Medication Sig Dispense Refill  . busPIRone (BUSPAR) 10 MG tablet Take 1 tablet (10 mg total) by mouth 3 (three) times daily. 90 tablet 1  . clonazePAM (KLONOPIN) 0.5 MG tablet Take 1 tablet (0.5 mg total) by mouth daily as needed for anxiety. 30 tablet 1  . lamoTRIgine (LAMICTAL) 200 MG tablet Take 1 tablet (200 mg total) by mouth daily. 30 tablet 1    No current facility-administered medications for this visit.      Musculoskeletal: Strength & Muscle Tone: within normal limits Gait & Station: normal Patient leans: N/A  Psychiatric Specialty Exam: Review of Systems  Respiratory: Negative.   Cardiovascular: Negative.   Gastrointestinal: Negative.   Musculoskeletal: Positive for back pain (behind right shoulder, worse with stress).  Neurological: Negative for tingling, tremors and weakness.  Psychiatric/Behavioral: Positive for depression. Negative for hallucinations, memory loss, substance abuse and suicidal ideas. The patient is nervous/anxious and has insomnia.     Blood pressure 128/74, height 5\' 10"  (1.778 m), weight 178  lb (80.7 kg).Body mass index is 25.54 kg/m.  General Appearance: Casual  Eye Contact:  Good  Speech:  Clear and Coherent and Pressured  Volume:  Normal  Mood:  Anxious, Dysphoric and Irritable  Affect:  Congruent  Thought Process:  Goal Directed and Linear  Orientation:  Full (Time, Place, and Person)  Thought Content: Hallucinations: None and Rumination   Suicidal Thoughts:  No  Homicidal Thoughts:  No  Memory:  Immediate;   Good Recent;   Good Remote;   Good  Judgement:  Good  Insight:  Good  Psychomotor Activity:  Normal  Concentration:  Concentration: Good and Attention Span: Good  Recall:  Good  Fund of Knowledge: Good  Language: Good  Akathisia:  No  Handed:  Right  AIMS (if indicated): not done  Assets:  Communication Skills Desire for Improvement Financial Resources/Insurance Housing Intimacy Physical Health Resilience Social Support  ADL's:  Intact  Cognition: WNL  Sleep:  Poor   Screenings: PHQ2-9     Office Visit from 02/04/2018 in [facility]  PHQ-2 Total Score  0       Assessment and Plan: Bipolar 2 disorder, depressed type.  GAD. Insomnia.  Patient Instructions   Start Prozac 20 mg daily for depression and anxiety.   We will draw labs  today to determine how you metabolize medications and then can make dose adjustment based on that.    Start Klonopin 0.5 mg twice daily in morning and at night for anxiety while titrating other medications.  This medication can be weaned after Prozac is at effective dose.  (decrease risk of dementia).  Decrease Buspar to twice a day 10:30 AM and 2:30 PM for one week. Then decrease to once a day at 2:30 PM for one week then stop.  Continue Lamictal 200 mg daily for mood stabilization.  Patient is agreeable to considering counseling, and I have asked for him to make an appointment today.  Recommended to call us back if he has any question or any concern.    Time spent 45 minutes.  More than 50% of the time spent in medication education, psychoeducation, counseling and coordination of care.  Discuss safety plan that anytime having active suicidal thoughts or homicidal thoughts then patient need to call 911 or go to the local emergency room.   Follow-up in 1 month    Lavella Hammock, MD 06/29/2018, 11:05 AM

## 2018-06-29 NOTE — Patient Instructions (Signed)
  Start Prozac 20 mg daily for depression and anxiety.    We will draw labs today to determine how you metabolize medications and then can make dose adjustment based on that.    Start Klonopin 0.5 mg twice daily in morning and at night.  This medication can be weaned after Prozac is at effective dose.  (decrease risk of dementia).      Decrease Buspar to twice a day 10:30 AM and 2:30 PM for one week.  Then decrease to once a day at 2:30 PM for one week then stop.

## 2018-06-30 ENCOUNTER — Encounter: Payer: Self-pay | Admitting: Family

## 2018-06-30 ENCOUNTER — Ambulatory Visit: Payer: 59 | Admitting: Family

## 2018-06-30 VITALS — BP 130/80 | HR 75 | Temp 97.8°F | Ht 70.0 in | Wt 178.0 lb

## 2018-06-30 DIAGNOSIS — M542 Cervicalgia: Secondary | ICD-10-CM

## 2018-06-30 DIAGNOSIS — M62838 Other muscle spasm: Secondary | ICD-10-CM

## 2018-06-30 MED ORDER — METHYLPREDNISOLONE 4 MG PO TBPK: ORAL_TABLET | ORAL | 0 refills | Status: DC

## 2018-06-30 MED ORDER — METHOCARBAMOL 500 MG PO TABS: 500.0000 mg | ORAL_TABLET | Freq: Three times a day (TID) | ORAL | 0 refills | Status: DC | PRN

## 2018-06-30 MED ORDER — HYDROCODONE-ACETAMINOPHEN 5-325 MG PO TABS: 1.0000 | ORAL_TABLET | Freq: Four times a day (QID) | ORAL | 0 refills | Status: DC | PRN

## 2018-06-30 NOTE — Progress Notes (Signed)
Jermaine Kidd is a 52 y.o. male with the following history as recorded in Epic[facility]:  Patient Active Problem List   Diagnosis Date Noted  . Erectile dysfunction 06/23/2016  . Hyperglycemia 01/08/2015  . Insomnia secondary to situational depression 12/12/2014  . Bipolar disorder ([place]) 07/01/2014  . Low serum testosterone level 03/01/2014  . Anxiety disorder 07/21/2012    Current Outpatient Medications  Medication Sig Dispense Refill  . busPIRone (BUSPAR) 10 MG tablet Take 1 tablet (10 mg total) by mouth 3 (three) times daily. 90 tablet 1  . clonazePAM (KLONOPIN) 0.5 MG tablet Take 1 tablet (0.5 mg total) by mouth 2 (two) times daily. 60 tablet 2  . FLUoxetine (PROZAC) 20 MG capsule Take 1 capsule (20 mg total) by mouth daily. 90 capsule 0  . lamoTRIgine (LAMICTAL) 200 MG tablet Take 1 tablet (200 mg total) by mouth daily. 90 tablet 0  . HYDROcodone-acetaminophen (NORCO) 5-325 MG tablet Take 1 tablet by mouth every 6 (six) hours as needed for moderate pain. 10 tablet 0  . methocarbamol (ROBAXIN) 500 MG tablet Take 1 tablet (500 mg total) by mouth every 8 (eight) hours as needed. As needed muscle spasm 20 tablet 0  . methylPREDNISolone (MEDROL DOSEPAK) 4 MG TBPK tablet Taper as directed 21 tablet 0   No current facility-administered medications for this visit.     Allergies: Patient has no known allergies.  Past Medical History:  Diagnosis Date  . Anxiety   . Anxiety disorder    onset age 33    Past Surgical History:  Procedure Laterality Date  . FACIAL RECONSTRUCTION SURGERY  2005   baseball injury  . SHOULDER SURGERY  2004    Family History  Problem Relation Age of Onset  . Osteoarthritis Father   . Alzheimer's disease Paternal Grandmother   . Alzheimer's disease Maternal Aunt   . Heart disease Maternal Grandmother   . Depression Son   . Diabetes Neg Hx   . Cancer Neg Hx   . Stroke Neg Hx     Social History   Tobacco Use  . Smoking status: Never Smoker  . Smokeless  tobacco: Current User    Types: Snuff  Substance Use Topics  . Alcohol use: No    Subjective:  Patient presents with neck pain- radiates down into right upper arm; thinks injured while lifting at the gym; has tried Ibuprofen/ Tylenol/ Tramadol with no benefit; denies sensation of numbness/ tingling but does feel pain down into his right arm; no headache;   Objective:  Vitals:   06/30/18 1453  BP: 130/80  Pulse: 75  Temp: 97.8 F (36.6 C)  TempSrc: Oral  SpO2: 96%  Weight: 178 lb (80.7 kg)  Height: 5\' 10"  (1.778 m)    General: Well developed, well nourished, in no acute distress  Skin : Warm and dry.  Head: Normocephalic and atraumatic  Neck: Supple without thyromegaly, adenopathy  Lungs: Respirations unlabored; clear to auscultation bilaterally without wheeze, rales, rhonchi  Musculoskeletal: No deformities; no active joint inflammation  Extremities: No edema, cyanosis, clubbing  Vessels: Symmetric bilaterally  Neurologic: Alert and oriented; speech intact; face symmetrical; moves all extremities well; CNII-XII intact without focal deficit   Assessment:  1. Neck pain   2. Muscle spasm     Plan:  Rx for Medrol dose pak, Robaxin and Norco; apply heat and follow-up worse, no better; to consider imaging if symptoms persist.   No follow-ups on file.  No orders of the defined types were  placed in this encounter.   Requested Prescriptions   Signed Prescriptions Disp Refills  . methylPREDNISolone (MEDROL DOSEPAK) 4 MG TBPK tablet 21 tablet 0    Sig: Taper as directed  . methocarbamol (ROBAXIN) 500 MG tablet 20 tablet 0    Sig: Take 1 tablet (500 mg total) by mouth every 8 (eight) hours as needed. As needed muscle spasm  . HYDROcodone-acetaminophen (NORCO) 5-325 MG tablet 10 tablet 0    Sig: Take 1 tablet by mouth every 6 (six) hours as needed for moderate pain.

## 2018-07-04 ENCOUNTER — Ambulatory Visit (HOSPITAL_COMMUNITY): Payer: 59 | Admitting: Psychiatry

## 2018-07-12 ENCOUNTER — Other Ambulatory Visit (HOSPITAL_COMMUNITY): Payer: Self-pay | Admitting: Psychiatry

## 2018-07-12 DIAGNOSIS — F3181 Bipolar II disorder: Secondary | ICD-10-CM

## 2018-07-26 ENCOUNTER — Ambulatory Visit (HOSPITAL_COMMUNITY): Payer: Self-pay | Admitting: Licensed Clinical Social Worker

## 2018-07-28 ENCOUNTER — Encounter (HOSPITAL_COMMUNITY): Payer: Self-pay | Admitting: Psychiatry

## 2018-07-28 ENCOUNTER — Ambulatory Visit (INDEPENDENT_AMBULATORY_CARE_PROVIDER_SITE_OTHER): Payer: 59 | Admitting: Psychiatry

## 2018-07-28 ENCOUNTER — Other Ambulatory Visit: Payer: Self-pay

## 2018-07-28 VITALS — BP 143/74 | HR 73 | Resp 16 | Ht 70.0 in | Wt 173.0 lb

## 2018-07-28 DIAGNOSIS — F3181 Bipolar II disorder: Secondary | ICD-10-CM | POA: Diagnosis not present

## 2018-07-28 DIAGNOSIS — F411 Generalized anxiety disorder: Secondary | ICD-10-CM

## 2018-07-28 DIAGNOSIS — G4700 Insomnia, unspecified: Secondary | ICD-10-CM | POA: Diagnosis not present

## 2018-07-28 MED ORDER — VILAZODONE HCL 20 MG PO TABS: 20.0000 mg | ORAL_TABLET | Freq: Every day | ORAL | 1 refills | Status: DC

## 2018-07-28 MED ORDER — LAMOTRIGINE 150 MG PO TABS: 300.0000 mg | ORAL_TABLET | Freq: Every day | ORAL | 1 refills | Status: DC

## 2018-07-28 NOTE — Progress Notes (Signed)
[facility] MD/PA/NP OP Progress Note  07/28/2018 2:03 PM Jermaine Kidd  MRN:  [id number]  Chief Complaint: I am feeling very anxious and nervous.  I do not think Prozac working from anxiety.  HPI: Patient came for his follow-up appointment.  Patient was last seen by covering physician who recommended to discontinue BuSpar and Prozac was started.  Patient told that he does not feel any improvement and he remained very anxious and nervous.  He also feeling some time lack of emotions and feeling zombie.  He is taking Klonopin twice a day which makes him tired.  In the past he had tried Effexor which also does not help him.  Patient has gene testing which shows Viibryd, Zoloft, Terie Purser and Pristiq has a better chance to work.  Also Klonopin, BuSpar, Lunesta and Ambien had a better chance to work for his sleep.  He is already taking Klonopin.  Lamictal is also in a favorable column.  Sometimes he does not enjoy his surroundings.  He is not able to enjoy his life with the family.  Though he denies any suicidal thoughts or homicidal thought but complain of social isolation and withdrawn.  He endorsed decreased energy however he is sleeping better.  Denies drinking or using any illegal substances.  Patient lives with his 30 year old daughter and 66 year old son and his wife of 3 years who is very supportive.  He denies any panic attack.  He denies any crying spells or any feeling of hopelessness or worthlessness.  He denies any paranoia or any hallucination.  He denies drinking alcohol or using any illegal substances.  He he hurt his right shoulder and he took pain medication and muscle relaxant which he stopped recently.  Visit Diagnosis:    ICD-10-CM   1. Bipolar II disorder, moderate, depressed, with anxious distress (HCC) F31.81 lamoTRIgine (LAMICTAL) 150 MG tablet  2. Generalized anxiety disorder F41.1 Vilazodone HCl (VIIBRYD) 20 MG TABS    Past Psychiatric History: Reviewed Patient denies any history of  psychiatric inpatient treatment or any suicidal attempt. He started seeing psychiatrist at [facility] but he was not happy with the treatment and then he started per Dr. Toy Cookey. He tried Prozac, Ambien, Cymbalta, Paxil, Wellbutrin, Lexapro, Seroquel and Klonopin. He reported sexual side effects with most of the antidepressant. He admitted history of irritability, impulsive behavior and depression.  Past Medical History:  Past Medical History:  Diagnosis Date  . Anxiety   . Anxiety disorder    onset age 84    Past Surgical History:  Procedure Laterality Date  . FACIAL RECONSTRUCTION SURGERY  2005   baseball injury  . SHOULDER SURGERY  2004    Family Psychiatric History: Reviewed  Family History:  Family History  Problem Relation Age of Onset  . Osteoarthritis Father   . Alzheimer's disease Paternal Grandmother   . Alzheimer's disease Maternal Aunt   . Heart disease Maternal Grandmother   . Depression Son   . Diabetes Neg Hx   . Cancer Neg Hx   . Stroke Neg Hx     Social History:  Social History   Socioeconomic History  . Marital status: Married    Spouse name: [name]  . Number of children: 4  . Years of education: college  . Highest education level: Not on file  Occupational History  . Occupation: Press photographer  Social Needs  . Financial resource strain: Not on file  . Food insecurity:    Worry: Not on file    Inability: Not  on file  . Transportation needs:    Medical: Not on file    Non-medical: Not on file  Tobacco Use  . Smoking status: Never Smoker  . Smokeless tobacco: Current User    Types: Snuff  Substance and Sexual Activity  . Alcohol use: No  . Drug use: No  . Sexual activity: Yes    Partners: Female    Birth control/protection: None  Lifestyle  . Physical activity:    Days per week: Not on file    Minutes per session: Not on file  . Stress: Not on file  Relationships  . Social connections:    Talks on phone: Not on file    Gets together:  Not on file    Attends religious service: Not on file    Active member of club or organization: Not on file    Attends meetings of clubs or organizations: Not on file    Relationship status: Not on file  Other Topics Concern  . Not on file  Social History Narrative  . Not on file    Allergies: No Known Allergies  Metabolic Disorder Labs: Lab Results  Component Value Date   HGBA1C 5.5 04/07/2017   MPG 111 04/07/2017   No results found for: PROLACTIN No results found for: CHOL, TRIG, HDL, CHOLHDL, VLDL, LDLCALC Lab Results  Component Value Date   TSH 0.58 04/07/2017   TSH 0.83 01/08/2015    Therapeutic Level Labs: No results found for: LITHIUM No results found for: VALPROATE No components found for:  CBMZ  Current Medications: Current Outpatient Medications  Medication Sig Dispense Refill  . clonazePAM (KLONOPIN) 0.5 MG tablet Take 1 tablet (0.5 mg total) by mouth 2 (two) times daily. 60 tablet 2  . FLUoxetine (PROZAC) 20 MG capsule Take 1 capsule (20 mg total) by mouth daily. 90 capsule 0  . lamoTRIgine (LAMICTAL) 200 MG tablet Take 1 tablet (200 mg total) by mouth daily. 90 tablet 0  . busPIRone (BUSPAR) 10 MG tablet Take 1 tablet (10 mg total) by mouth 3 (three) times daily. (Patient not taking: Reported on 07/28/2018) 90 tablet 1  . HYDROcodone-acetaminophen (NORCO) 5-325 MG tablet Take 1 tablet by mouth every 6 (six) hours as needed for moderate pain. (Patient not taking: Reported on 07/28/2018) 10 tablet 0  . methocarbamol (ROBAXIN) 500 MG tablet Take 1 tablet (500 mg total) by mouth every 8 (eight) hours as needed. As needed muscle spasm (Patient not taking: Reported on 07/28/2018) 20 tablet 0  . methylPREDNISolone (MEDROL DOSEPAK) 4 MG TBPK tablet Taper as directed (Patient not taking: Reported on 07/28/2018) 21 tablet 0   No current facility-administered medications for this visit.      Musculoskeletal: Strength & Muscle Tone: within normal limits Gait &  Station: normal Patient leans: Right  Psychiatric Specialty Exam: Review of Systems  Constitutional: Negative.   Musculoskeletal: Positive for joint pain.       Shoulder pain  Skin: Negative.  Negative for itching and rash.  Psychiatric/Behavioral: Positive for depression. The patient is nervous/anxious and has insomnia.     Blood pressure (!) 143/74, pulse 73, resp. rate 16, height 5\' 10"  (1.778 m), weight 173 lb (78.5 kg).Body mass index is 24.82 kg/m.  General Appearance: Casual  Eye Contact:  Fair  Speech:  Slow  Volume:  Normal  Mood:  Anxious, Dysphoric and Tired  Affect:  Congruent and Constricted  Thought Process:  Goal Directed  Orientation:  Full (Time, Place, and Person)  Thought  Content: Rumination   Suicidal Thoughts:  No  Homicidal Thoughts:  No  Memory:  Immediate;   Good Recent;   Good Remote;   Good  Judgement:  Good  Insight:  Good  Psychomotor Activity:  Normal  Concentration:  Concentration: Fair and Attention Span: Fair  Recall:  Good  Fund of Knowledge: Good  Language: Good  Akathisia:  No  Handed:  Right  AIMS (if indicated): not done  Assets:  Communication Skills Desire for Improvement Housing Social Support  ADL's:  Intact  Cognition: WNL  Sleep:  Fair   Screenings: PHQ2-9     Office Visit from 02/04/2018 in [facility]  PHQ-2 Total Score  0       Assessment and Plan: Bipolar disorder, depressed type.  Generalized anxiety disorder.  Insomnia.  I reviewed previous notes and collateral information.  He had tried Effexor, Prozac recently with poor outcome.  I reviewed gene testing with him in detail.  I recommended to try Viibryd 10 mg for 1 week and then 20 mg daily.  Samples provided.  I also recommended to increase Lamictal 225 mg for 2 weeks and then 300 mg daily.  I explained that feeling tired could be medication related since he increase the Klonopin 0.5 mg twice a day by previous physician.  He used to take  Klonopin only as needed.  I recommended once Viibryd start working he may need to consider lowering his Klonopin.  Patient understand the treatment plan.  He is not interested in counseling at this time.  Discussed safety concerns at any time having active suicidal thoughts or homicidal thought that he need to call 911 or go to local emergency room.  I will see him again in 4 to 6 weeks.  Time spent 25 minutes.  More than 50% of the time spent in psychoeducation, counseling, coronation of care, reviewing his chart and also time spent to discuss his gene testing results.   Kathlee Nations, MD 07/28/2018, 2:03 PM

## 2018-08-01 ENCOUNTER — Ambulatory Visit: Payer: 59 | Admitting: Family

## 2018-08-01 ENCOUNTER — Encounter: Payer: Self-pay | Admitting: Family

## 2018-08-01 ENCOUNTER — Ambulatory Visit: Payer: Self-pay | Admitting: Family

## 2018-08-01 ENCOUNTER — Ambulatory Visit (INDEPENDENT_AMBULATORY_CARE_PROVIDER_SITE_OTHER)
Admission: RE | Admit: 2018-08-01 | Discharge: 2018-08-01 | Disposition: A | Discharge status: Home / Self Care | Payer: 59 | Source: Ambulatory Visit | Attending: Family | Admitting: Family

## 2018-08-01 VITALS — BP 134/82 | HR 70 | Temp 97.7°F | Ht 70.0 in | Wt 177.0 lb

## 2018-08-01 DIAGNOSIS — M542 Cervicalgia: Secondary | ICD-10-CM

## 2018-08-01 DIAGNOSIS — M47812 Spondylosis without myelopathy or radiculopathy, cervical region: Secondary | ICD-10-CM | POA: Diagnosis not present

## 2018-08-01 MED ORDER — HYDROCODONE-ACETAMINOPHEN 5-325 MG PO TABS: 1.0000 | ORAL_TABLET | Freq: Four times a day (QID) | ORAL | 0 refills | Status: DC | PRN

## 2018-08-01 MED ORDER — METHOCARBAMOL 500 MG PO TABS: 500.0000 mg | ORAL_TABLET | Freq: Three times a day (TID) | ORAL | 0 refills | Status: DC | PRN

## 2018-08-01 MED ORDER — PREDNISONE 20 MG PO TABS: 40.0000 mg | ORAL_TABLET | Freq: Every day | ORAL | 0 refills | Status: DC

## 2018-08-01 NOTE — Progress Notes (Signed)
Jermaine Kidd is a 52 y.o. male with the following history as recorded in Epic[facility]:  Patient Active Problem List   Diagnosis Date Noted  . Erectile dysfunction 06/23/2016  . Hyperglycemia 01/08/2015  . Insomnia secondary to situational depression 12/12/2014  . Bipolar disorder ([place]) 07/01/2014  . Low serum testosterone level 03/01/2014  . Anxiety disorder 07/21/2012    Current Outpatient Medications  Medication Sig Dispense Refill  . clonazePAM (KLONOPIN) 0.5 MG tablet Take 1 tablet (0.5 mg total) by mouth 2 (two) times daily. 60 tablet 2  . lamoTRIgine (LAMICTAL) 150 MG tablet Take 2 tablets (300 mg total) by mouth daily. 60 tablet 1  . Vilazodone HCl (VIIBRYD) 20 MG TABS Take 1 tablet (20 mg total) by mouth daily. 30 tablet 1  . HYDROcodone-acetaminophen (NORCO) 5-325 MG tablet Take 1 tablet by mouth every 6 (six) hours as needed for moderate pain. 20 tablet 0  . methocarbamol (ROBAXIN) 500 MG tablet Take 1 tablet (500 mg total) by mouth every 8 (eight) hours as needed. 60 tablet 0  . predniSONE (DELTASONE) 20 MG tablet Take 2 tablets (40 mg total) by mouth daily with breakfast. 10 tablet 0   No current facility-administered medications for this visit.     Allergies: Patient has no known allergies.  Past Medical History:  Diagnosis Date  . Anxiety   . Anxiety disorder    onset age 8    Past Surgical History:  Procedure Laterality Date  . FACIAL RECONSTRUCTION SURGERY  2005   baseball injury  . SHOULDER SURGERY  2004    Family History  Problem Relation Age of Onset  . Osteoarthritis Father   . Alzheimer's disease Paternal Grandmother   . Alzheimer's disease Maternal Aunt   . Heart disease Maternal Grandmother   . Depression Son   . Diabetes Neg Hx   . Cancer Neg Hx   . Stroke Neg Hx     Social History   Tobacco Use  . Smoking status: Never Smoker  . Smokeless tobacco: Current User    Types: Snuff  Substance Use Topics  . Alcohol use: No    Subjective:   Patient was seen one month ago with complaints of neck pain; treated with Medrol Dose pak, muscle relaxer and pain medication; notes that symptoms have returned in the past 4-5 days; asked to follow-up if symptoms persist so imaging can be updated. Presents with same concerns today; No known injury or trauma to the neck; feels pain radiating into upper back but no numbness/ tingling into fingertips.    Objective:  Vitals:   08/01/18 0920  BP: 134/82  Pulse: 70  Temp: 97.7 F (36.5 C)  TempSrc: Oral  SpO2: 96%  Weight: 177 lb (80.3 kg)  Height: 5\' 10"  (1.778 m)    General: Well developed, well nourished, in no acute distress  Skin : Warm and dry.  Lungs: Respirations unlabored; clear to auscultation bilaterally without wheeze, rales, rhonchi  Musculoskeletal: No deformities; no active joint inflammation  Extremities: No edema, cyanosis, clubbing  Vessels: Symmetric bilaterally  Neurologic: Alert and oriented; speech intact; face symmetrical; moves all extremities well; CNII-XII intact without focal deficit   Assessment:  1. Neck pain     Plan:  Update cervical x-ray today; Rx for Prednisone 40 mg x 5 days; refill on Robaxin and Norco; will most likely have patient see sports medicine in follow-up.  No follow-ups on file.  Orders Placed This Encounter  Procedures  . DG Cervical Spine  2 or 3 views    Standing Status:   Future    Number of Occurrences:   1    Standing Expiration Date:   10/02/2019    Order Specific Question:   Reason for Exam (SYMPTOM  OR DIAGNOSIS REQUIRED)    Answer:   neck pain    Order Specific Question:   Preferred imaging location?    Answer:   [facility]    Order Specific Question:   Radiology Contrast Protocol - do NOT remove file path    Answer:   [email or web address]    Requested Prescriptions   Signed Prescriptions Disp Refills  . predniSONE (DELTASONE) 20 MG tablet 10 tablet 0    Sig: Take 2 tablets  (40 mg total) by mouth daily with breakfast.  . methocarbamol (ROBAXIN) 500 MG tablet 60 tablet 0    Sig: Take 1 tablet (500 mg total) by mouth every 8 (eight) hours as needed.  Marland Kitchen HYDROcodone-acetaminophen (NORCO) 5-325 MG tablet 20 tablet 0    Sig: Take 1 tablet by mouth every 6 (six) hours as needed for moderate pain.

## 2018-08-02 ENCOUNTER — Other Ambulatory Visit: Payer: Self-pay | Admitting: Family

## 2018-08-02 DIAGNOSIS — M542 Cervicalgia: Secondary | ICD-10-CM

## 2018-08-04 DIAGNOSIS — Z23 Encounter for immunization: Secondary | ICD-10-CM | POA: Diagnosis not present

## 2018-08-17 ENCOUNTER — Other Ambulatory Visit: Payer: Self-pay

## 2018-09-07 ENCOUNTER — Ambulatory Visit (INDEPENDENT_AMBULATORY_CARE_PROVIDER_SITE_OTHER): Payer: 59 | Admitting: Psychiatry

## 2018-09-07 DIAGNOSIS — F3181 Bipolar II disorder: Secondary | ICD-10-CM | POA: Diagnosis not present

## 2018-09-07 DIAGNOSIS — F411 Generalized anxiety disorder: Secondary | ICD-10-CM | POA: Diagnosis not present

## 2018-09-07 MED ORDER — CLONAZEPAM 0.5 MG PO TABS: 0.5000 mg | ORAL_TABLET | Freq: Every day | ORAL | 0 refills | Status: DC

## 2018-09-07 MED ORDER — VILAZODONE HCL 20 MG PO TABS: 20.0000 mg | ORAL_TABLET | Freq: Every day | ORAL | 1 refills | Status: DC

## 2018-09-07 MED ORDER — LAMOTRIGINE 150 MG PO TABS: 300.0000 mg | ORAL_TABLET | Freq: Every day | ORAL | 1 refills | Status: DC

## 2018-09-07 NOTE — Progress Notes (Signed)
[facility] MD/PA/NP OP Progress Note  09/07/2018 10:57 AM Jermaine Kidd  MRN:  [id number]  Chief Complaint: I like new medication.  It is helping my depression.  HPI: Jermaine Kidd came for his follow-up appointment.  On his last visit we started him on Viibryd and he is taking 20 mg daily.  We also increase Lamictal and he is now taking 300 mg daily.  He has no rash, itching, tremors or shakes.  He noticed much improvement in his energy, motivation and depression.  He is sleeping good.  Denies any mania, poor impulse control, psychosis or any hallucination.  He cut down his Klonopin and taking 0.5 mg at 6 PM which helps his anxiety and get better sleep at night.  He has a plan to spend Thanksgiving with his parents who lives close by.  Patient denies drinking or using any illegal substances.  He lives with his 30 year old daughter and 85 year old son and his wife for 3 years who is very supportive.    Visit Diagnosis:    ICD-10-CM   1. Generalized anxiety disorder F41.1 Vilazodone HCl (VIIBRYD) 20 MG TABS    clonazePAM (KLONOPIN) 0.5 MG tablet  2. Bipolar II disorder, moderate, depressed, with anxious distress (HCC) F31.81 lamoTRIgine (LAMICTAL) 150 MG tablet    clonazePAM (KLONOPIN) 0.5 MG tablet    DISCONTINUED: clonazePAM (KLONOPIN) 0.5 MG tablet    Past Psychiatric History: Reviewed. No history of psychiatric inpatient treatment or any suicidal attempt.  [facility]  Then saw Dr. Toy Cookey and tried Prozac, Ambien, Cymbalta, Paxil, Wellbutrin, Lexapro, Seroquel and Klonopin.  Reported sexual side effects with most of antidepressant.  History of irritability, impulsive behavior and depression. He had gene testing which shows Viibryd, Zoloft, Fetzima and Pristiq has a better chance to work.  Klonopin, BuSpar, Lunesta and Ambien had a better chance to work for his sleep.  Past Medical History:  Past Medical History:  Diagnosis Date  . Anxiety   . Anxiety  disorder    onset age 26    Past Surgical History:  Procedure Laterality Date  . FACIAL RECONSTRUCTION SURGERY  2005   baseball injury  . SHOULDER SURGERY  2004    Family Psychiatric History: Reviewed.  Family History:  Family History  Problem Relation Age of Onset  . Osteoarthritis Father   . Alzheimer's disease Paternal Grandmother   . Alzheimer's disease Maternal Aunt   . Heart disease Maternal Grandmother   . Depression Son   . Diabetes Neg Hx   . Cancer Neg Hx   . Stroke Neg Hx     Social History:  Social History   Socioeconomic History  . Marital status: Married    Spouse name: [name]  . Number of children: 4  . Years of education: college  . Highest education level: Not on file  Occupational History  . Occupation: Press photographer  Social Needs  . Financial resource strain: Not on file  . Food insecurity:    Worry: Not on file    Inability: Not on file  . Transportation needs:    Medical: Not on file    Non-medical: Not on file  Tobacco Use  . Smoking status: Never Smoker  . Smokeless tobacco: Current User    Types: Snuff  Substance and Sexual Activity  . Alcohol use: No  . Drug use: No  . Sexual activity: Yes    Partners: Female    Birth control/protection: None  Lifestyle  .  Physical activity:    Days per week: Not on file    Minutes per session: Not on file  . Stress: Not on file  Relationships  . Social connections:    Talks on phone: Not on file    Gets together: Not on file    Attends religious service: Not on file    Active member of club or organization: Not on file    Attends meetings of clubs or organizations: Not on file    Relationship status: Not on file  Other Topics Concern  . Not on file  Social History Narrative  . Not on file    Allergies: No Known Allergies  Metabolic Disorder Labs: Lab Results  Component Value Date   HGBA1C 5.5 04/07/2017   MPG 111 04/07/2017   No results found for: PROLACTIN No results found for:  CHOL, TRIG, HDL, CHOLHDL, VLDL, LDLCALC Lab Results  Component Value Date   TSH 0.58 04/07/2017   TSH 0.83 01/08/2015    Therapeutic Level Labs: No results found for: LITHIUM No results found for: VALPROATE No components found for:  CBMZ  Current Medications: Current Outpatient Medications  Medication Sig Dispense Refill  . clonazePAM (KLONOPIN) 0.5 MG tablet Take 1 tablet (0.5 mg total) by mouth 2 (two) times daily. 60 tablet 2  . HYDROcodone-acetaminophen (NORCO) 5-325 MG tablet Take 1 tablet by mouth every 6 (six) hours as needed for moderate pain. 20 tablet 0  . lamoTRIgine (LAMICTAL) 150 MG tablet Take 2 tablets (300 mg total) by mouth daily. 60 tablet 1  . methocarbamol (ROBAXIN) 500 MG tablet Take 1 tablet (500 mg total) by mouth every 8 (eight) hours as needed. 60 tablet 0  . predniSONE (DELTASONE) 20 MG tablet Take 2 tablets (40 mg total) by mouth daily with breakfast. 10 tablet 0  . Vilazodone HCl (VIIBRYD) 20 MG TABS Take 1 tablet (20 mg total) by mouth daily. 30 tablet 1   No current facility-administered medications for this visit.      Musculoskeletal: Strength & Muscle Tone: within normal limits Gait & Station: normal Patient leans: N/A  Psychiatric Specialty Exam: ROS  Blood pressure 128/74, height 5\' 10"  (1.778 m), weight 176 lb (79.8 kg).Body mass index is 25.25 kg/m.  General Appearance: Casual  Eye Contact:  Good  Speech:  Clear and Coherent  Volume:  Normal  Mood:  Euthymic  Affect:  Congruent  Thought Process:  Goal Directed  Orientation:  Full (Time, Place, and Person)  Thought Content: Logical   Suicidal Thoughts:  No  Homicidal Thoughts:  No  Memory:  Immediate;   Good Recent;   Good Remote;   Good  Judgement:  Good  Insight:  Good  Psychomotor Activity:  Normal  Concentration:  Concentration: Good and Attention Span: Good  Recall:  Good  Fund of Knowledge: Good  Language: Good  Akathisia:  No  Handed:  Right  AIMS (if indicated): not  done  Assets:  Communication Skills Desire for [place] Talents/Skills Transportation  ADL's:  Intact  Cognition: WNL  Sleep:  Good   Screenings: PHQ2-9     Office Visit from 02/04/2018 in [facility]  PHQ-2 Total Score  0       Assessment and Plan: Bipolar disorder, depressed type.  Generalized anxiety disorder.  Insomnia.  Patient doing better on Viibryd.  He is taking 20 mg daily and handling much better increase Lamictal.  He has no rash, itching  tremors or shakes.  He cut down his Klonopin only taking at 6 PM 0.5 mg which is also helping his sleep.  Patient is not interested in therapy.  I will continue Lamictal 300 mg daily, Viibryd 20 mg daily and Klonopin 0.5 mg at 6 PM.  Recommended to call us back if he has any question or any concern.  Follow-up in 3 months.   Kathlee Nations, MD 09/07/2018, 10:57 AM

## 2018-09-16 ENCOUNTER — Encounter: Payer: Self-pay | Admitting: Family

## 2018-09-16 ENCOUNTER — Other Ambulatory Visit: Payer: Self-pay

## 2018-10-25 IMAGING — DX DG CERVICAL SPINE 2 OR 3 VIEWS
2 series · 3 of 3 positions shown · non-contrast
Comparison: None.

CLINICAL DATA: Right-sided neck and shoulder pain over the last 2
months.

EXAM:
CERVICAL SPINE - 2-3 VIEW

[c-spine lat]
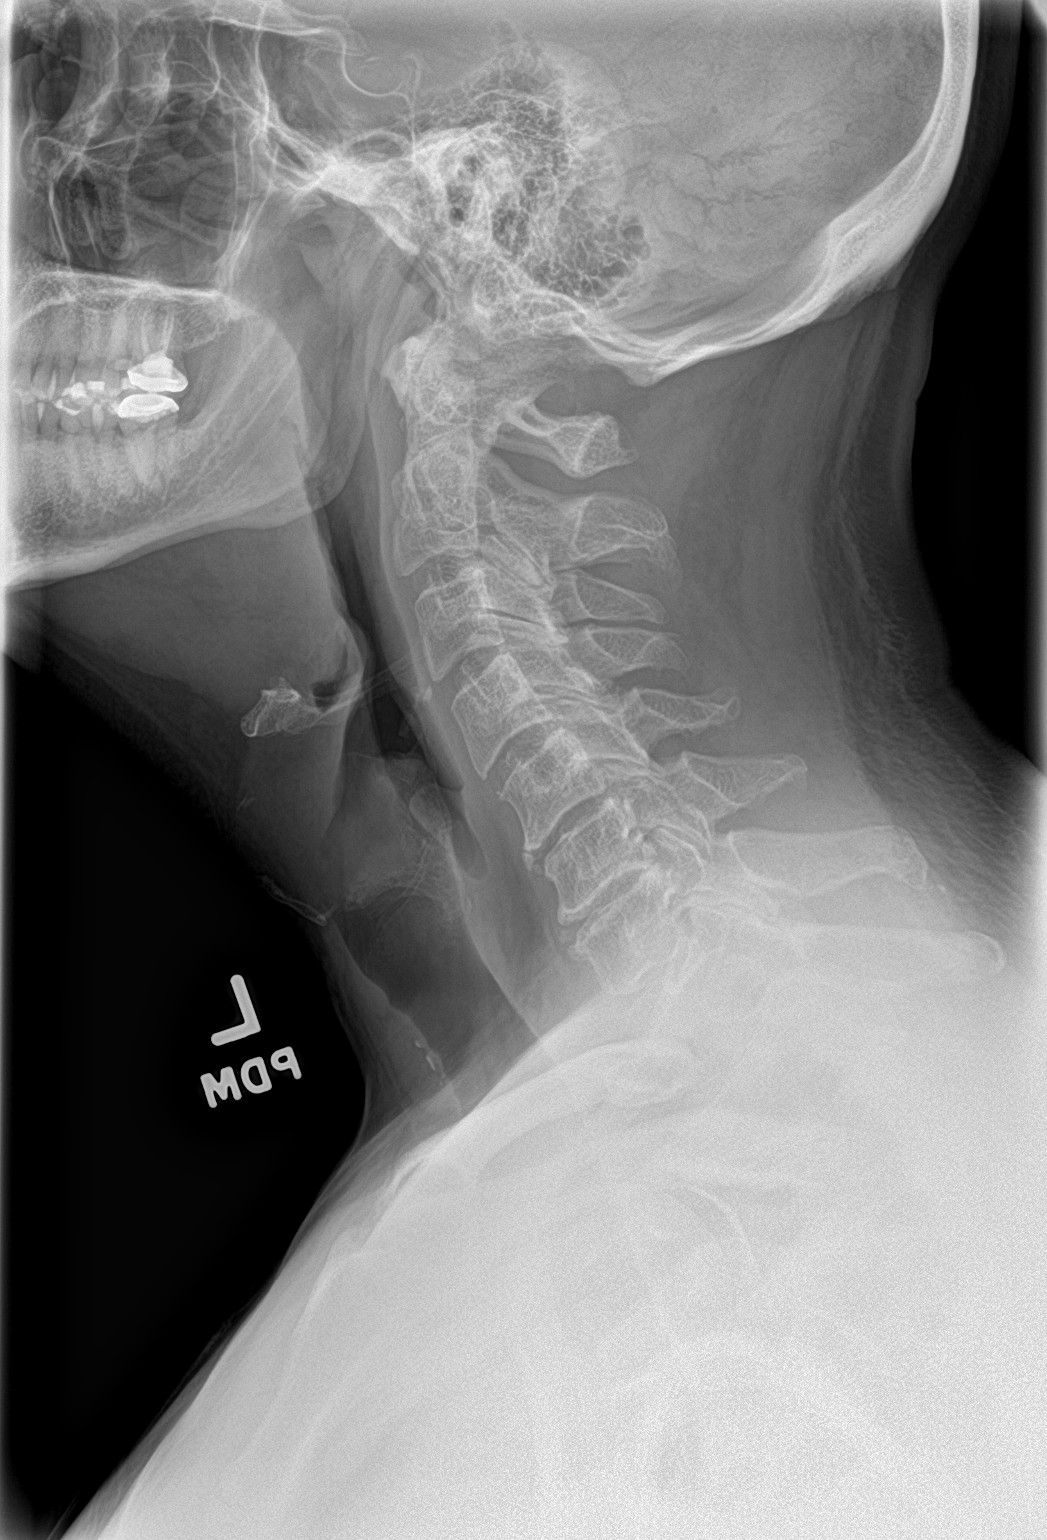

[Series 3: c-spine open mouth · 0.14mm/px · 2 of 2 slices shown]
[im 1/2]
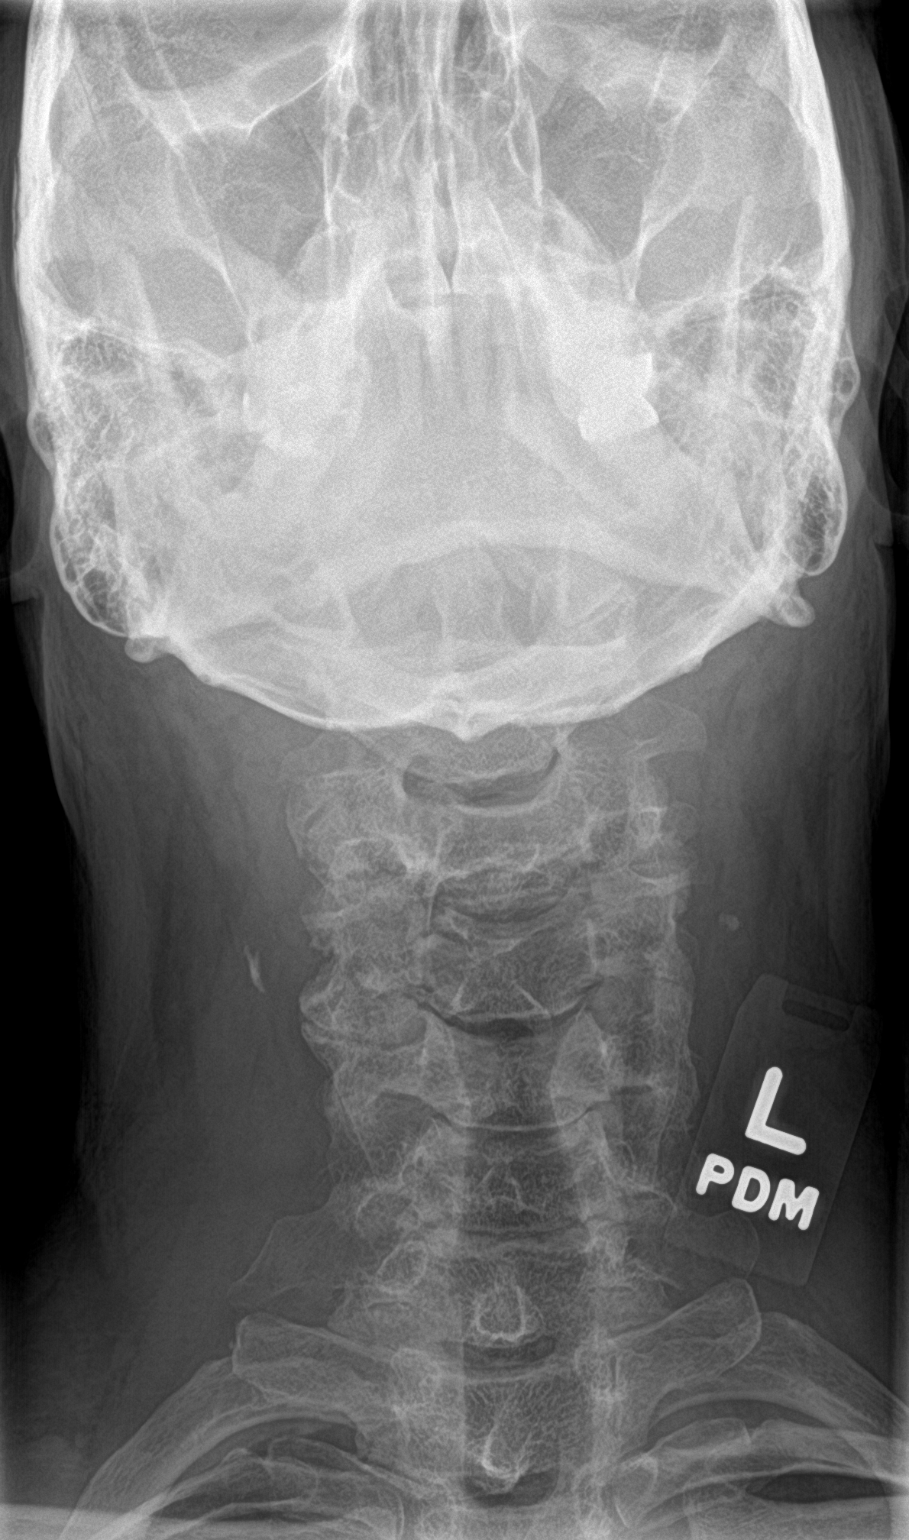
[im 2/2]
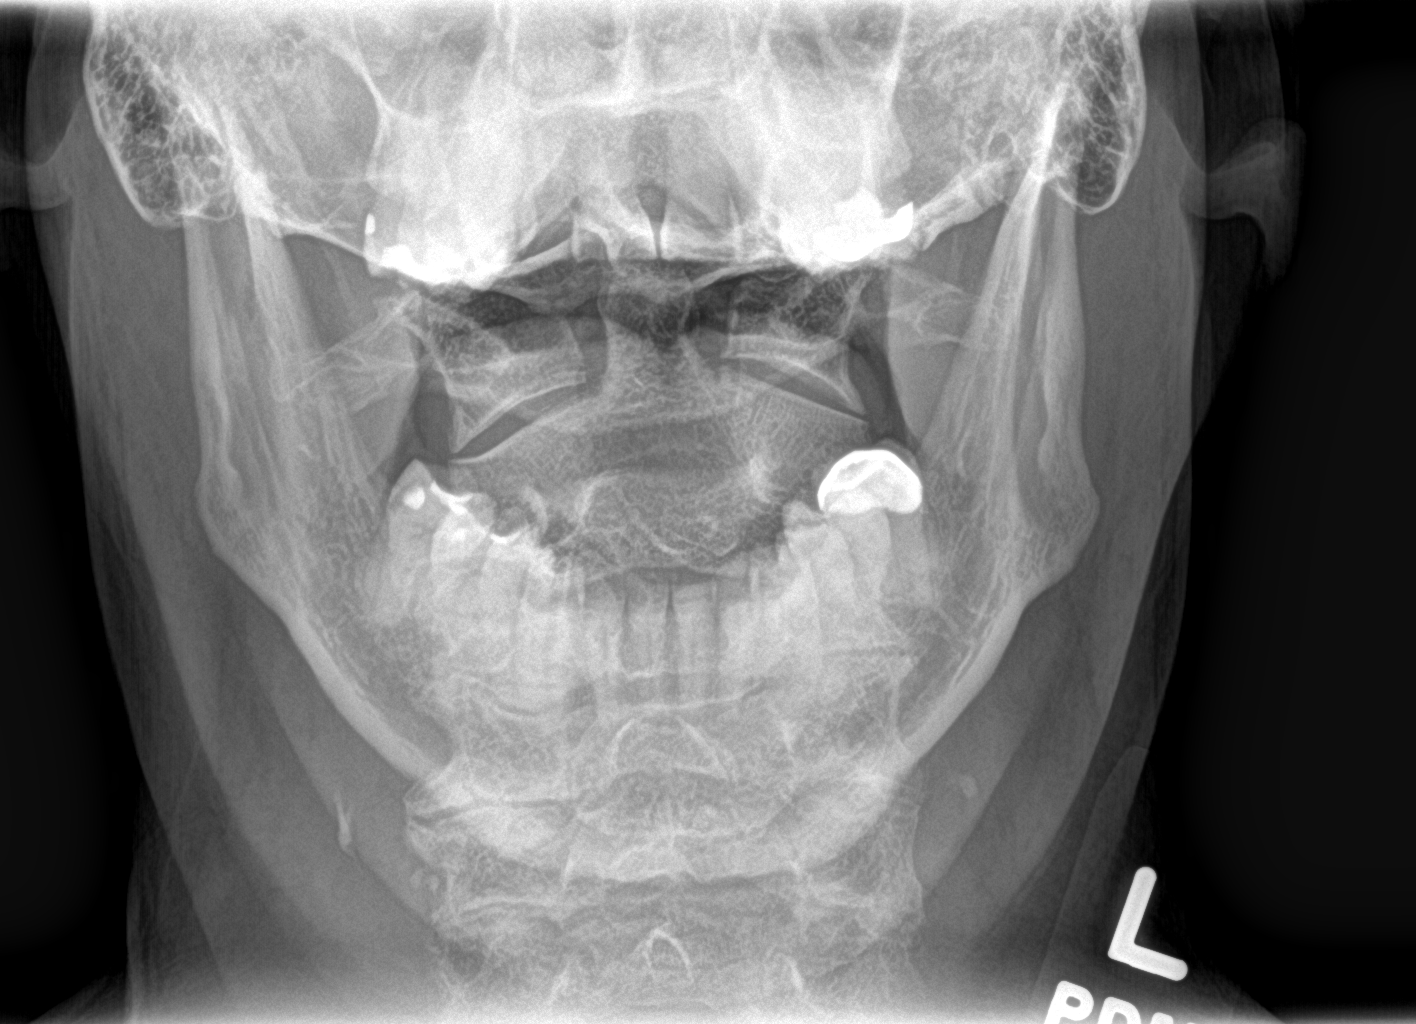

[3 of 3 positions shown; findings below may reference images not displayed]

FINDINGS: Mild straightening of the normal cervical lordosis. Disc space
narrowing with endplate osteophytes at C5-6, C6-7 and C7-T1. Facet
osteoarthritis more pronounced on the right than the left.
IMPRESSION: Degenerative spondylosis C5-6, C6-7 and C7-T1. Facet osteoarthritis
right more than left.

## 2018-10-27 DIAGNOSIS — Z1212 Encounter for screening for malignant neoplasm of rectum: Secondary | ICD-10-CM | POA: Diagnosis not present

## 2018-10-27 DIAGNOSIS — Z1211 Encounter for screening for malignant neoplasm of colon: Secondary | ICD-10-CM | POA: Diagnosis not present

## 2018-10-27 LAB — COLOGUARD: COLOGUARD: NEGATIVE

## 2018-11-02 ENCOUNTER — Encounter: Payer: Self-pay | Admitting: Family

## 2018-11-02 NOTE — Progress Notes (Signed)
Outside notes received. Information abstracted. Notes sent to scan.  

## 2018-11-04 ENCOUNTER — Encounter: Payer: Self-pay | Admitting: Family

## 2018-11-08 ENCOUNTER — Telehealth (HOSPITAL_COMMUNITY): Payer: Self-pay

## 2018-11-08 DIAGNOSIS — F411 Generalized anxiety disorder: Secondary | ICD-10-CM

## 2018-11-08 DIAGNOSIS — F3181 Bipolar II disorder: Secondary | ICD-10-CM

## 2018-11-08 MED ORDER — CLONAZEPAM 0.5 MG PO TABS: 0.5000 mg | ORAL_TABLET | Freq: Every day | ORAL | 0 refills | Status: DC

## 2018-11-08 NOTE — Telephone Encounter (Signed)
Prescription called into [facility] in [place].

## 2018-11-08 NOTE — Telephone Encounter (Signed)
Patient request a refill on Clonazepam 0.5mg  tabs. Next appointment is scheduled on 11-10-18

## 2018-11-10 ENCOUNTER — Encounter (HOSPITAL_COMMUNITY): Payer: Self-pay | Admitting: Psychiatry

## 2018-11-10 ENCOUNTER — Ambulatory Visit (INDEPENDENT_AMBULATORY_CARE_PROVIDER_SITE_OTHER): Payer: 59 | Admitting: Psychiatry

## 2018-11-10 DIAGNOSIS — F3181 Bipolar II disorder: Secondary | ICD-10-CM | POA: Diagnosis not present

## 2018-11-10 DIAGNOSIS — F411 Generalized anxiety disorder: Secondary | ICD-10-CM | POA: Diagnosis not present

## 2018-11-10 MED ORDER — CLONAZEPAM 0.5 MG PO TABS: 0.5000 mg | ORAL_TABLET | Freq: Every day | ORAL | 2 refills | Status: DC

## 2018-11-10 MED ORDER — LAMOTRIGINE 150 MG PO TABS: 300.0000 mg | ORAL_TABLET | Freq: Every day | ORAL | 2 refills | Status: DC

## 2018-11-10 MED ORDER — VILAZODONE HCL 20 MG PO TABS: 20.0000 mg | ORAL_TABLET | Freq: Every day | ORAL | 2 refills | Status: DC

## 2018-11-10 NOTE — Progress Notes (Signed)
Jermaine Kidd  11/10/2018 2:05 PM Jermaine Kidd  MRN:  [id number]  Chief Complaint: I am doing okay.  My depression is a stable.  HPI: Jermaine Kidd came for his follow-up appointment.  He is taking Viibryd, Klonopin and Lamictal.  He mentioned his depression is a stable.  He has no tremors shakes or any EPS.  He is sleeping good.  His energy level is good.  Sometime he feel that he has no emotions but he is happy that he has no irritability, poor impulse control, anger or any mood swings.  His sleep is good.  His job is going well.  He is taking Klonopin at bedtime which is helping his sleep.  His daughter who is applying for college did not get into state and [place].  Patient told her daughter was upset but he tried to reassure her as she got accepted at [employer] and [place].  Patient denies drinking or using any illegal substances.  He lives with his 1 year old daughter, 14 year old son and his wife.    Visit Diagnosis:    ICD-10-CM   1. Generalized anxiety disorder F41.1 Vilazodone HCl (VIIBRYD) 20 MG TABS    clonazePAM (KLONOPIN) 0.5 MG tablet  2. Bipolar II disorder, moderate, depressed, with anxious distress (HCC) F31.81 clonazePAM (KLONOPIN) 0.5 MG tablet    lamoTRIgine (LAMICTAL) 150 MG tablet    Past Psychiatric History: Reviewed. No history of inpatient treatment or any suicidal attempt.  [facility]  Then saw Dr. Toy Cookey and tried Prozac, Ambien, Cymbalta, Paxil, Wellbutrin, Lexapro, Seroquel and Klonopin.  Reported sexual side effects with most of antidepressant.  History of irritability, impulsive behavior and depression. He had gene testing which shows Viibryd, Zoloft, Fetzima and Pristiq has a better chance to work. Klonopin, BuSpar, Lunesta and Ambien had a better chance to work for his sleep.  Past Medical History:  Past Medical History:  Diagnosis Date  . Anxiety   . Anxiety disorder     onset age 3    Past Surgical History:  Procedure Laterality Date  . FACIAL RECONSTRUCTION SURGERY  2005   baseball injury  . SHOULDER SURGERY  2004    Family Psychiatric History: Reviewed.  Family History:  Family History  Problem Relation Age of Onset  . Osteoarthritis Father   . Alzheimer's disease Paternal Grandmother   . Alzheimer's disease Maternal Aunt   . Heart disease Maternal Grandmother   . Depression Son   . Diabetes Neg Hx   . Cancer Neg Hx   . Stroke Neg Hx     Social History:  Social History   Socioeconomic History  . Marital status: Married    Spouse name: [name]  . Number of children: 4  . Years of education: college  . Highest education level: Not on file  Occupational History  . Occupation: Press photographer  Social Needs  . Financial resource strain: Not on file  . Food insecurity:    Worry: Not on file    Inability: Not on file  . Transportation needs:    Medical: Not on file    Non-medical: Not on file  Tobacco Use  . Smoking status: Never Smoker  . Smokeless tobacco: Current User    Types: Snuff  Substance and Sexual Activity  . Alcohol use: No  . Drug use: No  . Sexual activity: Yes    Partners: Female    Birth control/protection: None  Lifestyle  . Physical activity:    Days per week: Not on file    Minutes per session: Not on file  . Stress: Not on file  Relationships  . Social connections:    Talks on phone: Not on file    Gets together: Not on file    Attends religious service: Not on file    Active member of club or organization: Not on file    Attends meetings of clubs or organizations: Not on file    Relationship status: Not on file  Other Topics Concern  . Not on file  Social History Narrative  . Not on file    Allergies: No Known Allergies  Metabolic Disorder Labs: Lab Results  Component Value Date   HGBA1C 5.5 04/07/2017   MPG 111 04/07/2017   No results found for: PROLACTIN No results found for: CHOL, TRIG,  HDL, CHOLHDL, VLDL, LDLCALC Lab Results  Component Value Date   TSH 0.58 04/07/2017   TSH 0.83 01/08/2015    Therapeutic Level Labs: No results found for: LITHIUM No results found for: VALPROATE No components found for:  CBMZ  Current Medications: Current Outpatient Medications  Medication Sig Dispense Refill  . clonazePAM (KLONOPIN) 0.5 MG tablet Take 1 tablet (0.5 mg total) by mouth daily. 30 tablet 0  . lamoTRIgine (LAMICTAL) 150 MG tablet Take 2 tablets (300 mg total) by mouth daily. 60 tablet 1  . Vilazodone HCl (VIIBRYD) 20 MG TABS Take 1 tablet (20 mg total) by mouth daily. 30 tablet 1  . HYDROcodone-acetaminophen (NORCO) 5-325 MG tablet Take 1 tablet by mouth every 6 (six) hours as needed for moderate pain. (Patient not taking: Reported on 11/10/2018) 20 tablet 0  . methocarbamol (ROBAXIN) 500 MG tablet Take 1 tablet (500 mg total) by mouth every 8 (eight) hours as needed. (Patient not taking: Reported on 11/10/2018) 60 tablet 0   No current facility-administered medications for this visit.      Musculoskeletal: Strength & Muscle Tone: within normal limits Gait & Station: normal Patient leans: N/A  Psychiatric Specialty Exam: ROS  Blood pressure 118/76, pulse 68, height 5\' 10"  (1.778 m), weight 179 lb (81.2 kg), SpO2 98 %.Body mass index is 25.68 kg/m.  General Appearance: Casual  Eye Contact:  Good  Speech:  Clear and Coherent  Volume:  Normal  Mood:  Euthymic  Affect:  Congruent  Thought Process:  Goal Directed  Orientation:  Full (Time, Place, and Person)  Thought Content: Logical   Suicidal Thoughts:  No  Homicidal Thoughts:  No  Memory:  Immediate;   Good Recent;   Good Remote;   Good  Judgement:  Good  Insight:  Good  Psychomotor Activity:  Normal  Concentration:  Concentration: Good and Attention Span: Good  Recall:  Good  Fund of Knowledge: Good  Language: Good  Akathisia:  No  Handed:  Right  AIMS (if indicated): not done  Assets:   Communication Skills Desire for Improvement Housing Resilience Social Support Talents/Skills  ADL's:  Intact  Cognition: WNL  Sleep:  Good   Screenings: PHQ2-9     Office Visit from 02/04/2018 in [facility]  PHQ-2 Total Score  0       Assessment and Plan: Bipolar disorder type II.  Generalized anxiety disorder.  Patient is stable on his current medication.  Discussed that if he feels that he has no emotion we can consider lowering the medication but patient does not want to change the dose  as he is feeling much better and believes his depression irritability and mood is better controlled.  Recommended to call us back if is any question or any concern.  Follow-up in 3 months.  Continue Viibryd 20 mg daily, Klonopin 0.5 mg at bedtime and Lamictal 300 g daily.  Patient has no rash, itching, tremors or shakes.   Kathlee Nations, MD 11/10/2018, 2:05 PM

## 2018-12-02 ENCOUNTER — Other Ambulatory Visit (HOSPITAL_COMMUNITY): Payer: Self-pay

## 2018-12-02 DIAGNOSIS — F411 Generalized anxiety disorder: Secondary | ICD-10-CM

## 2018-12-02 DIAGNOSIS — F3181 Bipolar II disorder: Secondary | ICD-10-CM

## 2018-12-02 MED ORDER — VILAZODONE HCL 20 MG PO TABS: 20.0000 mg | ORAL_TABLET | Freq: Every day | ORAL | 2 refills | Status: DC

## 2018-12-02 MED ORDER — CLONAZEPAM 0.5 MG PO TABS: 0.5000 mg | ORAL_TABLET | Freq: Every day | ORAL | 2 refills | Status: DC

## 2018-12-02 MED ORDER — LAMOTRIGINE 150 MG PO TABS: 300.0000 mg | ORAL_TABLET | Freq: Every day | ORAL | 2 refills | Status: DC

## 2018-12-02 NOTE — Progress Notes (Unsigned)
Patient called because the pharmacy stated they did not have his medication. I checked Epic and all three medications were sent on 1/30 the day of his appointment. I spoke with [facility] and they told me that they never received them. I resent the prescriptions.

## 2018-12-23 ENCOUNTER — Encounter: Payer: Self-pay | Admitting: Family

## 2019-01-19 DIAGNOSIS — D1801 Hemangioma of skin and subcutaneous tissue: Secondary | ICD-10-CM | POA: Diagnosis not present

## 2019-01-19 DIAGNOSIS — L821 Other seborrheic keratosis: Secondary | ICD-10-CM | POA: Diagnosis not present

## 2019-01-19 DIAGNOSIS — L570 Actinic keratosis: Secondary | ICD-10-CM | POA: Diagnosis not present

## 2019-01-19 DIAGNOSIS — L814 Other melanin hyperpigmentation: Secondary | ICD-10-CM | POA: Diagnosis not present

## 2019-02-06 ENCOUNTER — Other Ambulatory Visit: Payer: Self-pay

## 2019-02-06 ENCOUNTER — Ambulatory Visit (INDEPENDENT_AMBULATORY_CARE_PROVIDER_SITE_OTHER): Payer: 59 | Admitting: Psychiatry

## 2019-02-06 ENCOUNTER — Encounter (HOSPITAL_COMMUNITY): Payer: Self-pay | Admitting: Psychiatry

## 2019-02-06 DIAGNOSIS — F3181 Bipolar II disorder: Secondary | ICD-10-CM | POA: Diagnosis not present

## 2019-02-06 DIAGNOSIS — F411 Generalized anxiety disorder: Secondary | ICD-10-CM | POA: Diagnosis not present

## 2019-02-06 MED ORDER — LAMOTRIGINE 150 MG PO TABS: 300.0000 mg | ORAL_TABLET | Freq: Every day | ORAL | 2 refills | Status: DC

## 2019-02-06 MED ORDER — CLONAZEPAM 0.5 MG PO TABS: 0.5000 mg | ORAL_TABLET | Freq: Every day | ORAL | 2 refills | Status: DC

## 2019-02-06 MED ORDER — VILAZODONE HCL 20 MG PO TABS: 20.0000 mg | ORAL_TABLET | Freq: Every day | ORAL | 2 refills | Status: DC

## 2019-02-06 NOTE — Progress Notes (Signed)
Virtual Visit via Telephone Note  I connected with Jermaine Kidd on 02/06/19 at  3:40 PM EDT by telephone and verified that I am speaking with the correct person using two identifiers.   I discussed the limitations, risks, security and privacy concerns of performing an evaluation and management service by telephone and the availability of in person appointments. I also discussed with the patient that there may be a patient responsible charge related to this service. The patient expressed understanding and agreed to proceed.   History of Present Illness: Patient was evaluated through phone conversation.  He is taking Klonopin Viibryd and Lamictal.  He has noticed rash in his both hands more than a month ago.  He described these rashes are very mild and it is not worsening and he does not believe it is related to Lamictal.  He does not want to change the medication or reduce the dose since it is working very well.  He is sleeping good.  He is working from his office.  He is not leaving the house unless it is important.  He denies any irritability, mood swings ups and downs and insomnia.  He lives with his 75 year old daughter, 21 year old son and his wife.  He denies drinking or using any illegal substances.  Like to continue his current medication.  Past Psychiatric History: Reviewed. No history of inpatient treatment or any suicidal attempt. [facility]. Then saw Dr. Toy Cookey and tried Prozac, Ambien, Cymbalta, Paxil, Wellbutrin, Lexapro, Seroquel and Klonopin. Reported sexual side effects with most of antidepressant. History of irritability, impulsive behavior and depression. He hadgene testing which shows Viibryd, Zoloft, Fetzima and Pristiq has a better chance to work. Klonopin, BuSpar, Lunesta and Ambien had a better chance to work for his sleep.   Observations/Objective: Mental status examination done on the phone.  Patient described his  mood good.  His speech is clear, coherent with normal tone and volume.  He denies any auditory or visual hallucination.  He denies any active or passive suicidal thoughts or homicidal thought.  His attention and concentration is good.  He is alert and oriented x3.  His fund of knowledge is adequate.  His cognition is intact.  He reported rash on his both hand which has been there for more than a month and is not progressing.  His insight judgment is okay.  Assessment and Plan: Bipolar disorder type I.  Generalized anxiety disorder.  Patient is a stable on his current medication.  We discussed about the rash and he does not believe it is caused by Lamictal since he has this for more than a month and is not getting worse.  However I offered to reduce the dose of Lamictal to see if it is related to Lamictal but he like to continue current dose and wanted to see the dermatologist for the rash.  I recommend to call us back if is any question or any concern.  Continue Viibryd 20 mg daily, clonazepam 0.5 mg at bedtime and Lamictal 300 mg daily.  Recommended to call us back if is any question or any concern.  Follow-up in 3 months.     Follow Up Instructions:    I discussed the assessment and treatment plan with the patient. The patient was provided an opportunity to ask questions and all were answered. The patient agreed with the plan and demonstrated an understanding of the instructions.   The patient was advised to call back  or seek an in-person evaluation if the symptoms worsen or if the condition fails to improve as anticipated.  I provided 20 minutes of non-face-to-face time during this encounter.   Kathlee Nations, MD

## 2019-02-07 DIAGNOSIS — L309 Dermatitis, unspecified: Secondary | ICD-10-CM | POA: Diagnosis not present

## 2019-05-08 ENCOUNTER — Encounter (HOSPITAL_COMMUNITY): Payer: Self-pay | Admitting: Psychiatry

## 2019-05-08 ENCOUNTER — Other Ambulatory Visit: Payer: Self-pay

## 2019-05-08 ENCOUNTER — Ambulatory Visit (INDEPENDENT_AMBULATORY_CARE_PROVIDER_SITE_OTHER): Payer: 59 | Admitting: Psychiatry

## 2019-05-08 DIAGNOSIS — F411 Generalized anxiety disorder: Secondary | ICD-10-CM

## 2019-05-08 DIAGNOSIS — F3181 Bipolar II disorder: Secondary | ICD-10-CM | POA: Diagnosis not present

## 2019-05-08 MED ORDER — LAMOTRIGINE 150 MG PO TABS: 300.0000 mg | ORAL_TABLET | Freq: Every day | ORAL | 2 refills | Status: DC

## 2019-05-08 MED ORDER — VIIBRYD 20 MG PO TABS: 20.0000 mg | ORAL_TABLET | Freq: Every day | ORAL | 2 refills | Status: DC

## 2019-05-08 MED ORDER — CLONAZEPAM 0.5 MG PO TABS: 0.5000 mg | ORAL_TABLET | Freq: Every day | ORAL | 2 refills | Status: DC

## 2019-05-08 NOTE — Progress Notes (Signed)
Virtual Visit via Telephone Note  I connected with [place] on 05/08/19 at  1:40 PM EDT by telephone and verified that I am speaking with the correct person using two identifiers.   I discussed the limitations, risks, security and privacy concerns of performing an evaluation and management service by telephone and the availability of in person appointments. I also discussed with the patient that there may be a patient responsible charge related to this service. The patient expressed understanding and agreed to proceed.   History of Present Illness: Patient was evaluated through phone session.  He is doing well on his medication.  He is eating-year-old daughter going to start college at [employer] state in few weeks and he is anxious about her.  However overall he described his mood is stable.  He denies any mania, psychosis, absent down or any anger.  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  He has no more complaint of rashes which she had in the past.  Like to continue his current medication which is Viibryd, Lamictal and Klonopin.  He lives with his daughter, 56 year old son and his wife.  He denies drinking or using any illegal substances.  He reported his appetite is okay and his weight is stable.  He denies drinking or using any illegal substances.   Past Psychiatric History:Reviewed. No history of inpatient treatment or any suicidal attempt. [facility]. Then saw Dr. Toy Cookey and tried Prozac, Ambien, Cymbalta, Paxil, Wellbutrin, Lexapro, Seroquel and Klonopin. Reported sexual side effects with most of antidepressant. History of irritability, impulsive behavior and depression. Hadgene testing which shows Viibryd, Zoloft, Fetzima and Pristiq has a better chance to work. Klonopin, BuSpar, Lunesta and Ambien had a better chance to work for his sleep.    Psychiatric Specialty Exam: Physical Exam  ROS  There were no vitals taken for  this visit.There is no height or weight on file to calculate BMI.  General Appearance: NA  Eye Contact:  NA  Speech:  Clear and Coherent  Volume:  Normal  Mood:  Anxious  Affect:  Appropriate  Thought Process:  Goal Directed  Orientation:  Full (Time, Place, and Person)  Thought Content:  WDL  Suicidal Thoughts:  No  Homicidal Thoughts:  No  Memory:  Immediate;   Good Recent;   Good Remote;   Good  Judgement:  Good  Insight:  Good  Psychomotor Activity:  NA  Concentration:  Concentration: Good and Attention Span: Good  Recall:  Good  Fund of Knowledge:  Good  Language:  Good  Akathisia:  No  Handed:  Right  AIMS (if indicated):     Assets:  Communication Skills Desire for Improvement Housing Resilience Social Support Talents/Skills  ADL's:  Intact  Cognition:  WNL  Sleep:         Assessment and Plan: Bipolar disorder type I.  Generalized anxiety disorder.  Patient is a stable on his current psychotropic medication.  He has no more rash, itching, tremors or shakes.  He like to continue his current medication.  Continue Viibryd 20 mg daily, Klonopin 0.5 mg at bedtime and Lamictal 300 mg daily.  He is not interested in therapy.  Recommended to call us back if is any question or any concern.  Follow-up in 3 months.  Follow Up Instructions:    I discussed the assessment and treatment plan with the patient. The patient was provided an opportunity to ask questions  and all were answered. The patient agreed with the plan and demonstrated an understanding of the instructions.   The patient was advised to call back or seek an in-person evaluation if the symptoms worsen or if the condition fails to improve as anticipated.  I provided 20 minutes of non-face-to-face time during this encounter.   Kathlee Nations, MD

## 2019-08-08 ENCOUNTER — Ambulatory Visit (HOSPITAL_COMMUNITY): Payer: 59 | Admitting: Psychiatry

## 2019-08-18 ENCOUNTER — Other Ambulatory Visit (HOSPITAL_COMMUNITY): Payer: Self-pay | Admitting: Psychiatry

## 2019-08-18 DIAGNOSIS — F411 Generalized anxiety disorder: Secondary | ICD-10-CM

## 2019-08-22 ENCOUNTER — Other Ambulatory Visit (HOSPITAL_COMMUNITY): Payer: Self-pay | Admitting: Psychiatry

## 2019-08-22 DIAGNOSIS — F3181 Bipolar II disorder: Secondary | ICD-10-CM

## 2019-08-23 ENCOUNTER — Other Ambulatory Visit (HOSPITAL_COMMUNITY): Payer: Self-pay

## 2019-08-23 DIAGNOSIS — F411 Generalized anxiety disorder: Secondary | ICD-10-CM

## 2019-08-23 DIAGNOSIS — F3181 Bipolar II disorder: Secondary | ICD-10-CM

## 2019-08-23 MED ORDER — LAMOTRIGINE 150 MG PO TABS: 300.0000 mg | ORAL_TABLET | Freq: Every day | ORAL | 0 refills | Status: DC

## 2019-08-23 MED ORDER — CLONAZEPAM 0.5 MG PO TABS: 0.5000 mg | ORAL_TABLET | Freq: Every day | ORAL | 0 refills | Status: DC

## 2019-08-23 MED ORDER — VIIBRYD 20 MG PO TABS: 20.0000 mg | ORAL_TABLET | Freq: Every day | ORAL | 0 refills | Status: DC

## 2019-08-23 NOTE — Progress Notes (Unsigned)
Patient called for refills but did not have a follow up appt. I scheduled him for tomorrow and per protocol sent in a 30 day supply as patient stated he was completely out.

## 2019-08-24 ENCOUNTER — Encounter (HOSPITAL_COMMUNITY): Payer: Self-pay | Admitting: Psychiatry

## 2019-08-24 ENCOUNTER — Other Ambulatory Visit: Payer: Self-pay

## 2019-08-24 ENCOUNTER — Ambulatory Visit (INDEPENDENT_AMBULATORY_CARE_PROVIDER_SITE_OTHER): Payer: 59 | Admitting: Psychiatry

## 2019-08-24 DIAGNOSIS — F3181 Bipolar II disorder: Secondary | ICD-10-CM | POA: Diagnosis not present

## 2019-08-24 DIAGNOSIS — F411 Generalized anxiety disorder: Secondary | ICD-10-CM

## 2019-08-24 MED ORDER — LAMOTRIGINE 150 MG PO TABS: 300.0000 mg | ORAL_TABLET | Freq: Every day | ORAL | 2 refills | Status: DC

## 2019-08-24 MED ORDER — VIIBRYD 20 MG PO TABS: 20.0000 mg | ORAL_TABLET | Freq: Every day | ORAL | 2 refills | Status: DC

## 2019-08-24 NOTE — Progress Notes (Signed)
Virtual Visit via Telephone Note  I connected with [place] on 08/24/19 at  1:40 PM EST by telephone and verified that I am speaking with the correct person using two identifiers.   I discussed the limitations, risks, security and privacy concerns of performing an evaluation and management service by telephone and the availability of in person appointments. I also discussed with the patient that there may be a patient responsible charge related to this service. The patient expressed understanding and agreed to proceed.   History of Present Illness: Patient was evaluated by phone session.  He is doing better on his medication.  He is taking his medication as prescribed.  He takes Klonopin 0.5 mg half to 1 tablet as needed for anxiety. He is busy at work.  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  His 53 year old daughter is now staying with her and doing online college at [employer] state.  Patient lives with his daughter, 7 year old son and wife.  Denies drinking or using any illegal substances.  He like Lamictal which is helping his mood irritability and anger.  He denies any recent mania, impulsive behavior.  His energy level is good.  He admitted not going to gym because he close but his weight is unchanged from the past.  He has no rash, itching, tremors or shakes.  He like to continue his current medication.  He is not interested in therapy.  Past Psychiatric History:Reviewed. H/O irritability, impulsive behavior and depression.  No h/o inpatient or suicidal attempt.  [facility].  Saw Dr. Toy Cookey and tried Prozac, Ambien, Cymbalta, Paxil, Wellbutrin, Lexapro, Seroquel and Klonopin.  H/O sexual side effects with most of psychotropic medication. Had GeneSight testing shows Viibryd, Zoloft, [place], [place], [place], [place], [place] and Ambien are better choice.    Psychiatric Specialty Exam: Physical Exam  ROS   There were no vitals taken for this visit.There is no height or weight on file to calculate BMI.  General Appearance: NA  Eye Contact:  NA  Speech:  Clear and Coherent and Normal Rate  Volume:  Normal  Mood:  Euthymic  Affect:  NA  Thought Process:  Goal Directed  Orientation:  Full (Time, Place, and Person)  Thought Content:  WDL and Logical  Suicidal Thoughts:  No  Homicidal Thoughts:  No  Memory:  Immediate;   Good Recent;   Good Remote;   Good  Judgement:  Good  Insight:  Good  Psychomotor Activity:  NA  Concentration:  Concentration: Good and Attention Span: Good  Recall:  Good  Fund of Knowledge:  Good  Language:  Good  Akathisia:  No  Handed:  Right  AIMS (if indicated):     Assets:  Communication Skills Desire for [place] Talents/Skills Transportation  ADL's:  Intact  Cognition:  WNL  Sleep:   good      Assessment and Plan: Bipolar disorder type I.  Generalized anxiety disorder.  Patient is doing well on his current medication.  He is sleeping good.  He takes Klonopin 0.5 mg half to 1 tablet as needed.  Discussed medication side effects and benefits.  He has no rash, itching, tremors or shakes.  Continue Viibryd 20 mg daily, Lamictal 300 mg daily and Klonopin 0.5 mg half to 1 tablet as needed.  Recommended to call us back if is any question or any concern.  Follow-up in 3 months.  Follow Up Instructions:    I discussed the assessment and treatment plan with the patient. The patient was provided an opportunity to ask questions and all were answered. The patient agreed with the plan and demonstrated an understanding of the instructions.   The patient was advised to call back or seek an in-person evaluation if the symptoms worsen or if the condition fails to improve as anticipated.  I provided 15 minutes of non-face-to-face time during this encounter.   Kathlee Nations, MD

## 2019-10-22 ENCOUNTER — Telehealth: Payer: 59 | Admitting: Family

## 2019-10-22 DIAGNOSIS — J301 Allergic rhinitis due to pollen: Secondary | ICD-10-CM

## 2019-10-22 NOTE — Progress Notes (Signed)
E visit for Allergic Rhinitis We are sorry that you are not feeling well.  Here is how we plan to help!  Based on what you have shared with me it looks like you have Allergic Rhinitis.  Rhinitis is when a reaction occurs that causes nasal congestion, runny nose, sneezing, and itching.  Most types of rhinitis are caused by an inflammation and are associated with symptoms in the eyes ears or throat. There are several types of rhinitis.  The most common are acute rhinitis, which is usually caused by a viral illness, allergic or seasonal rhinitis, and nonallergic or year-round rhinitis.  Nasal allergies occur certain times of the year.  Allergic rhinitis is caused when allergens in the air trigger the release of histamine in the body.  Histamine causes itching, swelling, and fluid to build up in the fragile linings of the nasal passages, sinuses and eyelids.  An itchy nose and clear discharge are common.  I recommend the following over the counter treatments: You should take a daily dose of antihistamine and Xyzal 5 mg take 1 tablet daily  I also would recommend a nasal spray: Flonase 2 sprays into each nostril once daily and Saline 1 spray into each nostril as needed   HOME CARE:   You can use an over-the-counter saline nasal spray as needed  Avoid areas where there is heavy dust, mites, or molds  Stay indoors on windy days during the pollen season  Keep windows closed in home, at least in bedroom; use air conditioner.  Use high-efficiency house air filter  Keep windows closed in car, turn AC on re-circulate  Avoid playing out with dog during pollen season  GET HELP RIGHT AWAY IF:   If your symptoms do not improve within 10 days  You become short of breath  You develop yellow or green discharge from your nose for over 3 days  You have coughing fits  MAKE SURE YOU:   Understand these instructions  Will watch your condition  Will get help right away if you are not doing  well or get worse  Thank you for choosing an e-visit. Your e-visit answers were reviewed by a board certified advanced clinical practitioner to complete your personal care plan. Depending upon the condition, your plan could have included both over the counter or prescription medications. Please review your pharmacy choice. Be sure that the pharmacy you have chosen is open so that you can pick up your prescription now.  If there is a problem you may message your provider in [place] to have the prescription routed to another pharmacy. Your safety is important to [place]. If you have drug allergies check your prescription carefully.  For the next 24 hours, you can use MyChart to ask questions about today's visit, request a non-urgent call back, or ask for a work or school excuse from your e-visit provider. You will get an email in the next two days asking about your experience. I hope that your e-visit has been valuable and will speed your recovery.     Approximately 5 minutes was spent documenting and reviewing patient's chart.

## 2019-11-23 ENCOUNTER — Other Ambulatory Visit: Payer: Self-pay

## 2019-11-23 ENCOUNTER — Ambulatory Visit (INDEPENDENT_AMBULATORY_CARE_PROVIDER_SITE_OTHER): Payer: 59 | Admitting: Psychiatry

## 2019-11-23 ENCOUNTER — Encounter (HOSPITAL_COMMUNITY): Payer: Self-pay | Admitting: Psychiatry

## 2019-11-23 DIAGNOSIS — F411 Generalized anxiety disorder: Secondary | ICD-10-CM

## 2019-11-23 DIAGNOSIS — F3181 Bipolar II disorder: Secondary | ICD-10-CM

## 2019-11-23 MED ORDER — LAMOTRIGINE 150 MG PO TABS: 300.0000 mg | ORAL_TABLET | Freq: Every day | ORAL | 0 refills | Status: DC

## 2019-11-23 MED ORDER — CLONAZEPAM 0.5 MG PO TABS: 0.5000 mg | ORAL_TABLET | Freq: Two times a day (BID) | ORAL | 0 refills | Status: DC

## 2019-11-23 MED ORDER — VIIBRYD 20 MG PO TABS: 20.0000 mg | ORAL_TABLET | Freq: Every day | ORAL | 0 refills | Status: DC

## 2019-11-23 NOTE — Progress Notes (Signed)
Virtual Visit via Telephone Note  I connected with [place] on 11/23/19 at  2:00 PM EST by telephone and verified that I am speaking with the correct person using two identifiers.   I discussed the limitations, risks, security and privacy concerns of performing an evaluation and management service by telephone and the availability of in person appointments. I also discussed with the patient that there may be a patient responsible charge related to this service. The patient expressed understanding and agreed to proceed.   History of Present Illness: Patient was evaluated by phone session.  He has been taking his medication but lately he has noticed increased anxiety and anxiousness during the daytime.  He did not specify any stressors.  He reported his job is going well.  His family is doing well.  He is sleeping good with the Klonopin.  Denies any mania, psychosis, hallucination.  Denies any anger or any violence.  He is not sure what triggered his anxiety.  He has lost 13 pounds since the last visit.  He is trying to lose weight.  There has no new medication added recently.  He has no rash, itching tremors or shakes.  Past Psychiatric History:Reviewed. H/O irritability, impulsive behavior and depression.  No h/o inpatient or suicidal attempt.  [facility]  Saw Dr. Toy Cookey and tried Prozac, Ambien, Cymbalta, Paxil, Wellbutrin, Lexapro, Seroquel and Klonopin.  H/O sexual side effects with most of psychotropic medication. Had GeneSight testing shows Viibryd, Zoloft, [place], [place], [place], [place], [place] and Ambien are better choice.     Psychiatric Specialty Exam: Physical Exam  Review of Systems  There were no vitals taken for this visit.There is no height or weight on file to calculate BMI.  General Appearance: NA  Eye Contact:  NA  Speech:  Clear and Coherent  Volume:  Normal  Mood:  Anxious  Affect:  NA  Thought Process:   Goal Directed  Orientation:  Full (Time, Place, and Person)  Thought Content:  WDL  Suicidal Thoughts:  No  Homicidal Thoughts:  No  Memory:  Immediate;   Good Recent;   Good Remote;   Good  Judgement:  Intact  Insight:  Good  Psychomotor Activity:  NA  Concentration:  Concentration: Good and Attention Span: Good  Recall:  Good  Fund of Knowledge:  Good  Language:  NA  Akathisia:  No  Handed:  Right  AIMS (if indicated):     Assets:  Communication Skills Desire for Improvement Housing Resilience Social Support Talents/Skills Transportation  ADL's:  Intact  Cognition:  WNL  Sleep:   ok      Assessment and Plan: Bipolar disorder type I.  Generalized anxiety disorder.  It is unclear what triggered his anxiety.  Recommended to try Klonopin to take 0.5 mg twice a day to help his anxiety.  For now continue Viibryd 20 mg daily, Lamictal 300 mg daily.  I also offered therapy but patient is not interested.  I recommend if symptoms do not improve then he should have a physical since he lost 13 pounds.  He agreed with the plan.  Recommended to call us back if he has any question or any concern.  Follow-up in 4 weeks.  Follow Up Instructions:    I discussed the assessment and treatment plan with the patient. The patient was provided an opportunity to ask questions and all were answered. The patient agreed with the plan and demonstrated an  understanding of the instructions.   The patient was advised to call back or seek an in-person evaluation if the symptoms worsen or if the condition fails to improve as anticipated.  I provided 20 minutes of non-face-to-face time during this encounter.   Kathlee Nations, MD

## 2019-12-21 ENCOUNTER — Ambulatory Visit (HOSPITAL_COMMUNITY): Payer: 59 | Admitting: Psychiatry

## 2019-12-21 ENCOUNTER — Other Ambulatory Visit: Payer: Self-pay

## 2019-12-29 ENCOUNTER — Other Ambulatory Visit (HOSPITAL_COMMUNITY): Payer: Self-pay | Admitting: *Deleted

## 2019-12-29 DIAGNOSIS — F3181 Bipolar II disorder: Secondary | ICD-10-CM

## 2019-12-29 DIAGNOSIS — F411 Generalized anxiety disorder: Secondary | ICD-10-CM

## 2019-12-29 MED ORDER — CLONAZEPAM 0.5 MG PO TABS: 0.5000 mg | ORAL_TABLET | Freq: Two times a day (BID) | ORAL | 0 refills | Status: DC

## 2019-12-29 MED ORDER — LAMOTRIGINE 150 MG PO TABS: 300.0000 mg | ORAL_TABLET | Freq: Every day | ORAL | 0 refills | Status: DC

## 2019-12-29 MED ORDER — VIIBRYD 20 MG PO TABS: 20.0000 mg | ORAL_TABLET | Freq: Every day | ORAL | 0 refills | Status: DC

## 2020-01-01 ENCOUNTER — Ambulatory Visit (INDEPENDENT_AMBULATORY_CARE_PROVIDER_SITE_OTHER): Payer: 59 | Admitting: Psychiatry

## 2020-01-01 ENCOUNTER — Other Ambulatory Visit: Payer: Self-pay

## 2020-01-01 ENCOUNTER — Encounter (HOSPITAL_COMMUNITY): Payer: Self-pay | Admitting: Psychiatry

## 2020-01-01 DIAGNOSIS — F411 Generalized anxiety disorder: Secondary | ICD-10-CM

## 2020-01-01 DIAGNOSIS — F3181 Bipolar II disorder: Secondary | ICD-10-CM | POA: Diagnosis not present

## 2020-01-01 MED ORDER — CLONAZEPAM 0.5 MG PO TABS: 0.5000 mg | ORAL_TABLET | Freq: Two times a day (BID) | ORAL | 0 refills | Status: DC

## 2020-01-01 MED ORDER — VIIBRYD 20 MG PO TABS: 20.0000 mg | ORAL_TABLET | Freq: Every day | ORAL | 0 refills | Status: DC

## 2020-01-01 MED ORDER — LATUDA 20 MG PO TABS: 20.0000 mg | ORAL_TABLET | Freq: Every day | ORAL | 1 refills | Status: DC

## 2020-01-01 MED ORDER — LAMOTRIGINE 150 MG PO TABS: 300.0000 mg | ORAL_TABLET | Freq: Every day | ORAL | 0 refills | Status: DC

## 2020-01-01 NOTE — Progress Notes (Signed)
Virtual Visit via Telephone Note  I connected with Jermaine Kidd on 01/01/20 at  3:40 PM EDT by telephone and verified that I am speaking with the correct person using two identifiers.   I discussed the limitations, risks, security and privacy concerns of performing an evaluation and management service by telephone and the availability of in person appointments. I also discussed with the patient that there may be a patient responsible charge related to this service. The patient expressed understanding and agreed to proceed.   History of Present Illness: Patient was evaluated by phone session.  He is under a lot of stress and noticed increased irritability and anger.  His 50 year old mother died all of a sudden due to stroke.  Patient told mother was taking care of his 43 year old father who has dementia.  Now he has to hire a person because father cannot live by himself.  Patient told he has no other support system.  However he is seeing person since last November and that is going well.  He sleeps 5 to 6 hours.  He lost another 4 pounds since the last visit.  He is taking Klonopin but sometimes it make him sleepy.  He is concerned about his irritability and mood swing and he does not want to affect his relationship because of his behavior.  He is not happy because he is behind his work as he has to take a week off for his mother.  He also concerned about lack of sexual desire.  He takes Viagra but he feels does not help as much.  I checked his last testosterone level which was 137.  I mention he has a low level and he should consider seeing his PCP.  Denies any suicidal thoughts, hallucination, paranoia or any nightmares.  He has no rash or any itching.  He is not interested in therapy.  Past Psychiatric History:Reviewed. H/Oirritability, impulsive behavior and depression. No h/oinpatient or suicidal attempt. [facility]. Saw Dr. Toy Cookey and  tried Prozac, Ambien, Cymbalta, Paxil, Wellbutrin, Lexapro, Seroquel and Klonopin. H/Osexual side effects with most of psychotropic medication. Had GeneSight testing shows Viibryd, Zoloft, [place], [place], Klonopin, [place], Lunesta and Ambien arebetterchoice.   Psychiatric Specialty Exam: Physical Exam  Review of Systems  Weight 164 lb (74.4 kg).There is no height or weight on file to calculate BMI.  General Appearance: NA  Eye Contact:  NA  Speech:  Clear and Coherent  Volume:  Decreased  Mood:  Anxious and Dysphoric  Affect:  NA  Thought Process:  Descriptions of Associations: Intact  Orientation:  Full (Time, Place, and Person)  Thought Content:  Rumination  Suicidal Thoughts:  No  Homicidal Thoughts:  No  Memory:  Immediate;   Good Recent;   Good Remote;   Good  Judgement:  Intact  Insight:  Present  Psychomotor Activity:  NA  Concentration:  Concentration: Fair and Attention Span: Fair  Recall:  Good  Fund of Knowledge:  Good  Language:  Good  Akathisia:  No  Handed:  Right  AIMS (if indicated):     Assets:  Communication Skills Desire for Improvement Housing Resilience Social Support Transportation  ADL's:  Intact  Cognition:  WNL  Sleep:   5-6 hrs      Assessment and Plan: Generalized anxiety disorder.  Bipolar disorder type II.  Despite taking Klonopin 0.5 mg twice a day he continues to experience anxiety and having mood swings irritability.  Recent loss  of mother all of a sudden also contributed to his anxiety.  We discussed to try a new medication since he already had a higher dose of Lamictal.  He agreed with the plan.  I reviewed his GeneSight testing and Latuda appears to be in a favorable section.  We will start 20 mg Latuda however he will continue all his other medication as prescribed.  I offered therapy but patient declined.  I recommend he should consult PCP to have repeat testosterone level since last level was low.  He agreed with the plan.  I  will continue Viibryd 20 mg daily, Lamictal 300 mg daily and Klonopin 0.5 mg twice a day.  If Latuda helped his mood and anxiety then we will consider lowering his Lamictal and titrating further up his Latuda.  Recommended to call us back if is any question or concern.  Follow-up in 4 weeks.  Follow Up Instructions:    I discussed the assessment and treatment plan with the patient. The patient was provided an opportunity to ask questions and all were answered. The patient agreed with the plan and demonstrated an understanding of the instructions.   The patient was advised to call back or seek an in-person evaluation if the symptoms worsen or if the condition fails to improve as anticipated.  I provided 20 minutes of non-face-to-face time during this encounter.   Kathlee Nations, MD

## 2020-01-02 ENCOUNTER — Encounter: Payer: Self-pay | Admitting: Family

## 2020-02-01 ENCOUNTER — Other Ambulatory Visit: Payer: Self-pay

## 2020-02-01 ENCOUNTER — Encounter (HOSPITAL_COMMUNITY): Payer: Self-pay | Admitting: Psychiatry

## 2020-02-01 ENCOUNTER — Telehealth (INDEPENDENT_AMBULATORY_CARE_PROVIDER_SITE_OTHER): Payer: 59 | Admitting: Psychiatry

## 2020-02-01 DIAGNOSIS — F3181 Bipolar II disorder: Secondary | ICD-10-CM

## 2020-02-01 DIAGNOSIS — F411 Generalized anxiety disorder: Secondary | ICD-10-CM | POA: Diagnosis not present

## 2020-02-01 MED ORDER — VIIBRYD 20 MG PO TABS: 20.0000 mg | ORAL_TABLET | Freq: Every day | ORAL | 2 refills | Status: DC

## 2020-02-01 MED ORDER — LORAZEPAM 0.5 MG PO TABS: 0.5000 mg | ORAL_TABLET | Freq: Two times a day (BID) | ORAL | 2 refills | Status: DC

## 2020-02-01 MED ORDER — LATUDA 20 MG PO TABS: 20.0000 mg | ORAL_TABLET | Freq: Every day | ORAL | 2 refills | Status: DC

## 2020-02-01 MED ORDER — LAMOTRIGINE 150 MG PO TABS: 300.0000 mg | ORAL_TABLET | Freq: Every day | ORAL | 2 refills | Status: DC

## 2020-02-01 NOTE — Progress Notes (Signed)
Virtual Visit via Telephone Note  I connected with Jermaine Kidd on 02/01/20 at  3:40 PM EDT by telephone and verified that I am speaking with the correct person using two identifiers.   I discussed the limitations, risks, security and privacy concerns of performing an evaluation and management service by telephone and the availability of in person appointments. I also discussed with the patient that there may be a patient responsible charge related to this service. The patient expressed understanding and agreed to proceed.   History of Present Illness: Patient is evaluated by phone session.  On the last visit we started on Latuda and he is taking 20 mg daily.  He noticed his irritability anger and mood swings a somewhat better but he still feels anxious and stressed out.  Since mother died after stroke he is concerned about his father who is 71 year old and has dementia.  He had hired a person for him but he is stressed because her son is not doing a better job and now he is trying to find a different caretaker.  He admitted due to anxiety and the stress has been not eating well and he has lost 10 pounds since the last visit.  He takes Klonopin only at bedtime because it does help sleep but during the day it make him sleepy.  So far he is tolerating his medication and reported no tremors shakes or any EPS.  He is scheduled to see urology about lack of sexual desire.  Xarelto level is fair.  He is not interested in therapy.  He denies any suicidal thoughts or psychosis.   Past Psychiatric History:Reviewed. H/Oirritability, impulsive behavior and depression. No h/oinpatient or suicidal attempt. [facility]. Saw Dr. Toy Cookey and tried Prozac, Ambien, Cymbalta, Paxil, Wellbutrin, Lexapro, Seroquel and Klonopin. H/Osexual side effects with most of psychotropic medication. Had GeneSight testing shows Viibryd, Zoloft, [place], [place], Klonopin,  [place], Lunesta and Ambien arebetterchoice.   Psychiatric Specialty Exam: Physical Exam  Review of Systems  There were no vitals taken for this visit.There is no height or weight on file to calculate BMI.  General Appearance: NA  Eye Contact:  NA  Speech:  Clear and Coherent  Volume:  Normal  Mood:  Anxious  Affect:  NA  Thought Process:  Goal Directed  Orientation:  Full (Time, Place, and Person)  Thought Content:  Rumination  Suicidal Thoughts:  No  Homicidal Thoughts:  No  Memory:  Immediate;   Good Recent;   Good Remote;   Good  Judgement:  Intact  Insight:  Present  Psychomotor Activity:  NA  Concentration:  Concentration: Fair and Attention Span: Fair  Recall:  Good  Fund of Knowledge:  Good  Language:  Good  Akathisia:  No  Handed:  Right  AIMS (if indicated):     Assets:  Communication Skills Desire for Improvement Housing Resilience Talents/Skills Transportation  ADL's:  Intact  Cognition:  WNL  Sleep:   ok     Assessment and Plan: Bipolar disorder type II.  Generalized anxiety disorder.  I will discontinue Klonopin since he cannot take during the day as it make him more sleepy and groggy.  We will try Ativan 0.5 mg to take twice a day to help his anxiety.  We discussed benzodiazepine dependence.  Continue Latuda 20 mg daily, Viibryd 20 mg daily and Lamictal 300 mg daily.  He has no rash, itching tremors or shakes.  We will consider  cutting down the Lamictal in the future.  Patient is scheduled to see urology for his back of libido.  Follow-up in 3 months.  Follow Up Instructions:    I discussed the assessment and treatment plan with the patient. The patient was provided an opportunity to ask questions and all were answered. The patient agreed with the plan and demonstrated an understanding of the instructions.   The patient was advised to call back or seek an in-person evaluation if the symptoms worsen or if the condition fails to improve as  anticipated.  I provided 15 minutes of non-face-to-face time during this encounter.   Kathlee Nations, MD

## 2020-02-06 ENCOUNTER — Telehealth (HOSPITAL_COMMUNITY): Payer: Self-pay

## 2020-02-06 NOTE — Telephone Encounter (Signed)
Pt called requesting to speak with MD. Pt was hesitant to explain reason for MD call back. States he is having medication side effects. Pt did state he is has developed a rash. Pt instructed to stop taking Lamictal until he speaks with provider. He verbalized understanding. Requesting call back from MD to discuss.

## 2020-02-06 NOTE — Telephone Encounter (Signed)
I returned patient's phone call.  He is concerned about the rash which she described on his arm and at his shoulder for a while and he believes it could be due to working outside.  He reported the rash is not spreading and does not progressing.  I provided reassurance that it could be sun exposure since he has been working outside as little rash usually slept very fast and is spread all over the body.  He agreed and he will continue Lamictal however I mention if he changes any consistency of the rash then he should call us back.

## 2020-03-26 ENCOUNTER — Telehealth (HOSPITAL_COMMUNITY): Payer: Self-pay | Admitting: *Deleted

## 2020-03-26 ENCOUNTER — Other Ambulatory Visit (HOSPITAL_COMMUNITY): Payer: Self-pay | Admitting: *Deleted

## 2020-03-26 DIAGNOSIS — F411 Generalized anxiety disorder: Secondary | ICD-10-CM

## 2020-03-26 MED ORDER — VIIBRYD 20 MG PO TABS: 20.0000 mg | ORAL_TABLET | Freq: Every day | ORAL | 0 refills | Status: DC

## 2020-03-26 NOTE — Telephone Encounter (Signed)
Please do 90-day supply of Viibryd if his insurance permits and he is okay with it.

## 2020-03-26 NOTE — Telephone Encounter (Signed)
Pt called stating he can't fill Vibryd. Writer spoke with [employer] and was told that his insurance is requiring a 90 refill. Pt next visit on 05/07/20. Please review and advise.

## 2020-05-07 ENCOUNTER — Encounter (HOSPITAL_COMMUNITY): Payer: Self-pay | Admitting: Psychiatry

## 2020-05-07 ENCOUNTER — Other Ambulatory Visit: Payer: Self-pay

## 2020-05-07 ENCOUNTER — Ambulatory Visit (INDEPENDENT_AMBULATORY_CARE_PROVIDER_SITE_OTHER): Payer: 59 | Admitting: Psychiatry

## 2020-05-07 DIAGNOSIS — F3181 Bipolar II disorder: Secondary | ICD-10-CM | POA: Diagnosis not present

## 2020-05-07 DIAGNOSIS — F411 Generalized anxiety disorder: Secondary | ICD-10-CM | POA: Diagnosis not present

## 2020-05-07 MED ORDER — LAMOTRIGINE 150 MG PO TABS: 300.0000 mg | ORAL_TABLET | Freq: Every day | ORAL | 2 refills | Status: DC

## 2020-05-07 MED ORDER — VIIBRYD 20 MG PO TABS: 20.0000 mg | ORAL_TABLET | Freq: Every day | ORAL | 0 refills | Status: DC

## 2020-05-07 MED ORDER — LORAZEPAM 0.5 MG PO TABS: ORAL_TABLET | ORAL | 2 refills | Status: DC

## 2020-05-07 NOTE — Progress Notes (Signed)
Virtual Visit via Telephone Note  I connected with Jermaine Kidd on 05/07/20 at  3:20 PM EDT by telephone and verified that I am speaking with the correct person using two identifiers.  Location: Patient: work Provider: Home office   I discussed the limitations, risks, security and privacy concerns of performing an evaluation and management service by telephone and the availability of in person appointments. I also discussed with the patient that there may be a patient responsible charge related to this service. The patient expressed understanding and agreed to proceed.   History of Present Illness: Patient is evaluated by phone session.  He has been lately more anxious and nervous because he has not been taking Ativan.  Patient told he may have ran out and did not fill the prescription.  He struggles with insomnia and anxiety but his depression is stable.  He is no longer taking the Latuda because he felt more depressed.  He admitted missing her mother who died in Dec 09, 2022.  His father is old and has dementia and not doing well.  Denies any anger, mania, psychosis.  He goes to gym but not able to lose more weight.  Sometimes he feels overwhelmed.  He did go to urology appointment about lack of sexual desire but he was disappointed as did not get much help.  He is taking Viibryd and Lamictal.  He has no itching.  He has chronic rash on his hand which he has for a long time and does not feel related to Lamictal.  He is not interested in therapy.  He denies any severe mood swings, mania.  Past Psychiatric History:Reviewed. H/Oirritability, impulsive behavior and depression. No h/oinpatient or suicidal attempt. [facility] treatment. Saw Dr. Toy Cookey and tried Prozac, Ambien, Cymbalta, Paxil, Wellbutrin, Lexapro, Seroquel and Klonopin. H/Osexual side effects with most of psychotropic medication. Had GeneSight testing shows Viibryd, Zoloft, [place],  [place], Klonopin, [place], Lunesta and Ambien arebetterchoice.    Psychiatric Specialty Exam: Physical Exam  Review of Systems  Weight 169 lb (76.7 kg).There is no height or weight on file to calculate BMI.  General Appearance: NA  Eye Contact:  NA  Speech:  Clear and Coherent  Volume:  Normal  Mood:  Anxious  Affect:  NA  Thought Process:  Goal Directed  Orientation:  Full (Time, Place, and Person)  Thought Content:  Logical  Suicidal Thoughts:  No  Homicidal Thoughts:  No  Memory:  Immediate;   Fair Recent;   Good Remote;   Good  Judgement:  Intact  Insight:  Good  Psychomotor Activity:  NA  Concentration:  Concentration: Fair and Attention Span: Fair  Recall:  Good  Fund of Knowledge:  Good  Language:  Good  Akathisia:  No  Handed:  Right  AIMS (if indicated):     Assets:  Communication Skills Desire for [place] Talents/Skills Transportation  ADL's:  Intact  Cognition:  WNL  Sleep:   poor      Assessment and Plan: Bipolar disorder type II.  Generalized anxiety disorder.  Recommend to restart Ativan and should take every day since it did help him.  Patient promised that he will take the medicine as prescribed.  We will discontinue Latuda since it did not help and make him more depressed.  Continue Viibryd 20 mg daily and Lamictal 300 mg daily.  Discussed medication side effects and benefits.  Recommended to call us back if there is  any question or any concern.  Follow-up in 3 months.  Patient is not interested in therapy.  Follow Up Instructions:    I discussed the assessment and treatment plan with the patient. The patient was provided an opportunity to ask questions and all were answered. The patient agreed with the plan and demonstrated an understanding of the instructions.   The patient was advised to call back or seek an in-person evaluation if the symptoms worsen or if the condition fails to improve as  anticipated.  I provided 15 minutes of non-face-to-face time during this encounter.   Kathlee Nations, MD

## 2020-05-15 ENCOUNTER — Telehealth (HOSPITAL_COMMUNITY): Payer: Self-pay | Admitting: *Deleted

## 2020-05-15 NOTE — Telephone Encounter (Signed)
He just had a visit few days ago and we did talk about the medication.  If he feels the medicine is not working then he may need therapy.  He can consider IOP.  If interested then please refer him.

## 2020-05-15 NOTE — Telephone Encounter (Signed)
Patient called and stated that none of his current meds are working. Please review and advise. Patients next appt is 08/07/20.

## 2020-05-16 ENCOUNTER — Telehealth (HOSPITAL_COMMUNITY): Payer: Self-pay | Admitting: Psychiatry

## 2020-05-16 NOTE — Telephone Encounter (Signed)
D:  Dr. Theresia Lo, LPN referred pt to [facility].  A:  Placed call to orient patient and provide him with a start date.  "I am absolutely not interested in group at all."  Inform Dr. Adele Schilder and Selinda Eon.

## 2020-06-07 ENCOUNTER — Telehealth (INDEPENDENT_AMBULATORY_CARE_PROVIDER_SITE_OTHER): Payer: 59 | Admitting: Psychiatry

## 2020-06-07 ENCOUNTER — Encounter (HOSPITAL_COMMUNITY): Payer: Self-pay | Admitting: Psychiatry

## 2020-06-07 ENCOUNTER — Other Ambulatory Visit: Payer: Self-pay

## 2020-06-07 DIAGNOSIS — F3181 Bipolar II disorder: Secondary | ICD-10-CM | POA: Diagnosis not present

## 2020-06-07 DIAGNOSIS — F411 Generalized anxiety disorder: Secondary | ICD-10-CM | POA: Diagnosis not present

## 2020-06-07 MED ORDER — VIIBRYD 20 MG PO TABS: 20.0000 mg | ORAL_TABLET | Freq: Every day | ORAL | 0 refills | Status: DC

## 2020-06-07 MED ORDER — CLONAZEPAM 0.5 MG PO TABS: 0.5000 mg | ORAL_TABLET | Freq: Two times a day (BID) | ORAL | 2 refills | Status: DC

## 2020-06-07 MED ORDER — LAMOTRIGINE 150 MG PO TABS: 300.0000 mg | ORAL_TABLET | Freq: Every day | ORAL | 2 refills | Status: DC

## 2020-06-07 NOTE — Progress Notes (Signed)
Virtual Visit via Telephone Note  I connected with Jermaine Kidd on 06/07/20 at  9:20 AM EDT by telephone and verified that I am speaking with the correct person using two identifiers.  Location: Patient: work Provider: home office   I discussed the limitations, risks, security and privacy concerns of performing an evaluation and management service by telephone and the availability of in person appointments. I also discussed with the patient that there may be a patient responsible charge related to this service. The patient expressed understanding and agreed to proceed.   History of Present Illness: Patient is evaluated by phone session.  On the last visit we referred to IOP but he is not interested.  He requested earlier appointment because he like to go back on Klonopin.  He tried Ativan but it did not last long the efficacy.  He struggled with insomnia and he feels that Klonopin was helping more for his anxiety and sleep.  His daughter is now moved to state and son started going to school in person.  He endorsed chronic rash on exam and seeing dermatologist who diagnosed fungal infection and given treatment but he has not seen any improvement.  He is scheduled to see again his dermatologist.  He is compliant with lamotrigine and Viibryd.  He denies any mood swings, anger, mania or any psychosis.  He admitted recently broke up in his relationship and he was very sad about it.  Patient told that she was separated from her husband but after 2 we decided to go back with her husband.  Patient is disappointed but denies any crying spells or any feeling of hopelessness.  His job is going well.    Past Psychiatric History:Reviewed. H/Oirritability, impulsive behavior and depression. No h/oinpatient or suicidal attempt. [facility]. Saw Dr. Toy Cookey and tried Prozac, Ambien, Cymbalta, Paxil, Wellbutrin, Lexapro, Seroquel and Klonopin. H/Osexual side effects with most  of psychotropic medication. Had GeneSight testing shows Viibryd, Zoloft, [place], [place], Klonopin, [place], Lunesta and Ambien arebetterchoice.We tried [place] and Ativan.    Psychiatric Specialty Exam: Physical Exam  Review of Systems  There were no vitals taken for this visit.There is no height or weight on file to calculate BMI.  General Appearance: NA  Eye Contact:  NA  Speech:  Slow  Volume:  Decreased  Mood:  Anxious  Affect:  NA  Thought Process:  Goal Directed  Orientation:  Full (Time, Place, and Person)  Thought Content:  Rumination  Suicidal Thoughts:  No  Homicidal Thoughts:  No  Memory:  Immediate;   Good Recent;   Good Remote;   Good  Judgement:  Intact  Insight:  Present  Psychomotor Activity:  NA  Concentration:  Concentration: Fair and Attention Span: Good  Recall:  Good  Fund of Knowledge:  Good  Language:  Good  Akathisia:  No  Handed:  Right  AIMS (if indicated):     Assets:  Communication Skills Desire for Improvement Housing Resilience Talents/Skills Transportation  ADL's:  Intact  Cognition:  WNL  Sleep:   fair      Assessment and Plan: Bipolar disorder type II.  Generalized anxiety disorder.  Discontinue Ativan and patient like to go back on Klonopin.  We will try again Klonopin 0.5 mg twice a day.  I also discussed to consider gabapentin since he has not tried but patient like to go back on Klonopin first and if he decided to try gabapentin then he will call us.  I encourage him to talk to his pharmacist about gabapentin.  Continue Lamictal 300 mg daily and Viibryd 20 mg daily.  Recommended to call us back if is any question or any concern.  We have recommended IOP but patient is not interested.  He is also not interested in individual therapy.  Follow-up in 3 months  Follow Up Instructions:    I discussed the assessment and treatment plan with the patient. The patient was provided an opportunity to ask questions and all were answered.  The patient agreed with the plan and demonstrated an understanding of the instructions.   The patient was advised to call back or seek an in-person evaluation if the symptoms worsen or if the condition fails to improve as anticipated.  I provided 18 minutes of non-face-to-face time during this encounter.   Kathlee Nations, MD

## 2020-06-24 ENCOUNTER — Other Ambulatory Visit (HOSPITAL_COMMUNITY): Payer: Self-pay | Admitting: Psychiatry

## 2020-06-24 DIAGNOSIS — F411 Generalized anxiety disorder: Secondary | ICD-10-CM

## 2020-08-07 ENCOUNTER — Telehealth (HOSPITAL_COMMUNITY): Payer: 59 | Admitting: Psychiatry

## 2020-08-08 ENCOUNTER — Encounter (HOSPITAL_COMMUNITY): Payer: Self-pay | Admitting: Psychiatry

## 2020-08-08 ENCOUNTER — Other Ambulatory Visit: Payer: Self-pay

## 2020-08-08 ENCOUNTER — Telehealth (INDEPENDENT_AMBULATORY_CARE_PROVIDER_SITE_OTHER): Payer: 59 | Admitting: Psychiatry

## 2020-08-08 DIAGNOSIS — F411 Generalized anxiety disorder: Secondary | ICD-10-CM | POA: Diagnosis not present

## 2020-08-08 DIAGNOSIS — F3181 Bipolar II disorder: Secondary | ICD-10-CM

## 2020-08-08 MED ORDER — VIIBRYD 20 MG PO TABS: 20.0000 mg | ORAL_TABLET | Freq: Every day | ORAL | 0 refills | Status: DC

## 2020-08-08 MED ORDER — LAMOTRIGINE 150 MG PO TABS: 300.0000 mg | ORAL_TABLET | Freq: Every day | ORAL | 2 refills | Status: DC

## 2020-08-08 MED ORDER — GABAPENTIN 100 MG PO CAPS: 100.0000 mg | ORAL_CAPSULE | Freq: Two times a day (BID) | ORAL | 1 refills | Status: DC

## 2020-08-08 NOTE — Progress Notes (Signed)
Virtual Visit via Telephone Note  I connected with Jermaine Kidd on 08/08/20 at  4:00 PM EDT by telephone and verified that I am speaking with the correct person using two identifiers.  Location: Patient: In car Provider: Home office   I discussed the limitations, risks, security and privacy concerns of performing an evaluation and management service by telephone and the availability of in person appointments. I also discussed with the patient that there may be a patient responsible charge related to this service. The patient expressed understanding and agreed to proceed.   History of Present Illness: Patient is evaluated by phone session.  On the last visit he was very anxious and did not felt the Ativan working and wanted to put back on Klonopin.  We also talked about gabapentin but at that time he wanted to go back on Klonopin.  We have told him to try twice a day but he forgot to take twice a day.  He still feels anxious but his sleep is improved.  He feels his depression and mood irritability everything is much better with the Viibryd and Lamictal but he still feels very anxious.  He could not figure out what triggers the anxiety but he has a racing thoughts.  He is back to his girlfriend who have left him for her husband but now patient is pleased that she is back and now getting divorced from her husband.  She is living with him.  Patient also got finally divorced from her second wife who he has been separated for 4 years.  Patient is looking forward for a better relationship.  We have recommended IOP but patient does not feel he needed therapy but like to try something for anxiety.  He is now interested to try gabapentin.  Some nights he is struggled with sleep but Klonopin helps.  He endorses weight loss because he is anxious and sometimes does not eat regularly.  He has no tremors, shakes, rash or any itching.  He has now concern from Lamictal and Viibryd.  Past Psychiatric  History:Reviewed. H/Oirritability, impulsive behavior and depression. No h/oinpatient or suicidal attempt. [facility] with [facility] treatment. Saw Dr. Toy Cookey and tried Prozac, Ambien, Cymbalta, Paxil, Wellbutrin, Lexapro, Seroquel and Klonopin. H/Osexual side effects with most of psychotropic medication. Had GeneSight testing shows Viibryd, Zoloft, [place], [place], Klonopin, [place], Lunesta and Ambien arebetterchoice.   Psychiatric Specialty Exam: Physical Exam  Review of Systems  Weight 160 lb (72.6 kg).There is no height or weight on file to calculate BMI.  General Appearance: NA  Eye Contact:  NA  Speech:  Normal Rate  Volume:  Decreased  Mood:  Anxious  Affect:  NA  Thought Process:  Goal Directed  Orientation:  Full (Time, Place, and Person)  Thought Content:  WDL  Suicidal Thoughts:  No  Homicidal Thoughts:  No  Memory:  Immediate;   Good Recent;   Good Remote;   Good  Judgement:  Good  Insight:  Present  Psychomotor Activity:  NA  Concentration:  Concentration: Good and Attention Span: Good  Recall:  Good  Fund of Knowledge:  Good  Language:  Good  Akathisia:  No  Handed:  Right  AIMS (if indicated):     Assets:  Communication Skills Desire for Improvement Housing Resilience Social Support Talents/Skills  ADL's:  Intact  Cognition:  WNL  Sleep:   fair      Assessment and Plan: Bipolar disorder type II.  Generalized  anxiety disorder.  Patient sleep is better with Klonopin but he forgot to take second dose.  Now he wants to try gabapentin which we have discussed in the past.  I agreed to try gabapentin 100 mg twice a day and if that works and he will stop the Klonopin.  He like to continue Viibryd 20 mg daily and Lamictal 300 mg daily.  Discussed medication side effects and benefits.  He is not interested in therapy.  Like to follow-up in 6 weeks.  Recommended to call us back if is any question or any concern.  Follow-up in  6 weeks.  Follow Up Instructions:    I discussed the assessment and treatment plan with the patient. The patient was provided an opportunity to ask questions and all were answered. The patient agreed with the plan and demonstrated an understanding of the instructions.   The patient was advised to call back or seek an in-person evaluation if the symptoms worsen or if the condition fails to improve as anticipated.  I provided 20 minutes of non-face-to-face time during this encounter.   Kathlee Nations, MD

## 2020-09-17 ENCOUNTER — Other Ambulatory Visit: Payer: Self-pay

## 2020-09-17 ENCOUNTER — Encounter (HOSPITAL_COMMUNITY): Payer: Self-pay | Admitting: Psychiatry

## 2020-09-17 ENCOUNTER — Telehealth (INDEPENDENT_AMBULATORY_CARE_PROVIDER_SITE_OTHER): Payer: 59 | Admitting: Psychiatry

## 2020-09-17 DIAGNOSIS — F3181 Bipolar II disorder: Secondary | ICD-10-CM

## 2020-09-17 DIAGNOSIS — F411 Generalized anxiety disorder: Secondary | ICD-10-CM | POA: Diagnosis not present

## 2020-09-17 MED ORDER — VIIBRYD 20 MG PO TABS: 20.0000 mg | ORAL_TABLET | Freq: Every day | ORAL | 0 refills | Status: DC

## 2020-09-17 MED ORDER — CLONAZEPAM 0.5 MG PO TABS: 0.5000 mg | ORAL_TABLET | Freq: Every day | ORAL | 1 refills | Status: DC

## 2020-09-17 MED ORDER — LAMOTRIGINE 150 MG PO TABS: 300.0000 mg | ORAL_TABLET | Freq: Every day | ORAL | 1 refills | Status: DC

## 2020-09-17 MED ORDER — DESVENLAFAXINE SUCCINATE ER 25 MG PO TB24: 25.0000 mg | ORAL_TABLET | Freq: Every day | ORAL | 1 refills | Status: DC

## 2020-09-17 NOTE — Progress Notes (Signed)
Virtual Visit via Telephone Note  I connected with Jermaine Kidd on 09/17/20 at  4:00 PM EST by telephone and verified that I am speaking with the correct person using two identifiers.  Location: Patient: In Car Provider:Home Office   I discussed the limitations, risks, security and privacy concerns of performing an evaluation and management service by telephone and the availability of in person appointments. I also discussed with the patient that there may be a patient responsible charge related to this service. The patient expressed understanding and agreed to proceed.   History of Present Illness: Patient is evaluated by phone session.  Patient is on the phone by himself.  On the last visit we started him on gabapentin but patient felt very dizzy after taking twice.  He had stopped taking the medication because he does not want to have a fall.  He still have anxiety, irritability and sometimes depression.  He reported his relationship is going well and there is no recent stressor other than his job is very busy.  He is excited about going to [place] and [place] with his girlfriend in first week of January.  He is not sure what causing his anxiety and nervousness because things are going well for him.  He is taking Klonopin only at bedtime it is helping his sleep.  He is taking Lamictal, Viibryd.  He is open to try a different medication.  He is not interested in therapy.  He reported his appetite is okay but he had gained weight since the last visit.  Denies any mania, psychosis, anger or any poor impulse control.  Past Psychiatric History:Reviewed. H/Oirritability, impulsive behavior and depression. No h/oinpatient or suicidal attempt. [facility] not [facility] with the treatment. Saw Dr. Toy Cookey and tried Prozac, Ambien, Cymbalta, Paxil, Wellbutrin, Lexapro, Seroquel and Klonopin. H/Osexual side effects with most of psychotropic medication. Had GeneSight testing shows  Viibryd, Zoloft, [place], [place], Klonopin, [place], Lunesta and Ambien arebetterchoice.  Psychiatric Specialty Exam: Physical Exam  Review of Systems  Weight 170 lb (77.1 kg).There is no height or weight on file to calculate BMI.  General Appearance: NA  Eye Contact:  NA  Speech:  Clear and Coherent  Volume:  Decreased  Mood:  Anxious and Dysphoric  Affect:  NA  Thought Process:  Goal Directed  Orientation:  Full (Time, Place, and Person)  Thought Content:  Rumination  Suicidal Thoughts:  No  Homicidal Thoughts:  No  Memory:  Immediate;   Good Recent;   Good Remote;   Good  Judgement:  Good  Insight:  Present  Psychomotor Activity:  NA  Concentration:  Concentration: Good and Attention Span: Good  Recall:  Good  Fund of Knowledge:  Good  Language:  Good  Akathisia:  No  Handed:  Right  AIMS (if indicated):     Assets:  Communication Skills Desire for Improvement Housing Resilience Social Support Talents/Skills Transportation  ADL's:  Intact  Cognition:  WNL  Sleep:   ok      Assessment and Plan: Bipolar disorder type II.  Generalized anxiety disorder.  Discontinue gabapentin since patient had a side effects and he feels very dizzy and about to fall.  I reviewed his past history.  We had a gene site testing and Pristiq is one of the medication that he had never tried before.  He like to try something new.  He had tried Effexor that caused weight gain.  I recommend to try Pristiq 25 mg along  with his current medication.  We discussed medication side effects and benefits.  If he feels the Pristiq is working we may optimize the dose and may consider discontinuing Viibryd.  He has no rash, itching, tremors or shakes.  I offered therapy but patient does not want therapy at this time.  Recommended to call us back if is any question or any concern.  Follow-up in 6 weeks.  Follow Up Instructions:    I discussed the assessment and treatment plan with the patient. The  patient was provided an opportunity to ask questions and all were answered. The patient agreed with the plan and demonstrated an understanding of the instructions.   The patient was advised to call back or seek an in-person evaluation if the symptoms worsen or if the condition fails to improve as anticipated.  I provided 15 minutes of non-face-to-face time during this encounter.   Kathlee Nations, MD

## 2020-10-21 ENCOUNTER — Encounter (HOSPITAL_COMMUNITY): Payer: Self-pay | Admitting: Psychiatry

## 2020-10-21 ENCOUNTER — Other Ambulatory Visit: Payer: Self-pay

## 2020-10-21 ENCOUNTER — Telehealth (INDEPENDENT_AMBULATORY_CARE_PROVIDER_SITE_OTHER): Payer: 59 | Admitting: Psychiatry

## 2020-10-21 DIAGNOSIS — F3181 Bipolar II disorder: Secondary | ICD-10-CM | POA: Diagnosis not present

## 2020-10-21 DIAGNOSIS — F411 Generalized anxiety disorder: Secondary | ICD-10-CM

## 2020-10-21 MED ORDER — CLONAZEPAM 1 MG PO TABS: 0.5000 mg | ORAL_TABLET | Freq: Every day | ORAL | 1 refills | Status: DC

## 2020-10-21 NOTE — Progress Notes (Signed)
Virtual Visit via Telephone Note  I connected with Jermaine Kidd on 10/21/20 at  4:20 PM EST by telephone and verified that I am speaking with the correct person using two identifiers.  Location: Patient: Home Provider: Home Office   I discussed the limitations, risks, security and privacy concerns of performing an evaluation and management service by telephone and the availability of in person appointments. I also discussed with the patient that there may be a patient responsible charge related to this service. The patient expressed understanding and agreed to proceed.   History of Present Illness: Patient is evaluated by phone session.  He is on the phone by himself.  He was last evaluated 4 weeks ago and at that time he was complaining of dizziness and had stopped the gabapentin.  He wanted to try different medication and we started him on Pristiq after reviewing his GeneSight testing.  However he did not start the medicine because of the Christmas and he does not want any side effects.  He is now going to start the medication soon.  He is disappointed because his trip to [place] with his girlfriend was postponed.  His girlfriend was going to [facility] but is not sure when it will happen again.  He reported his Christmas was okay.  Since he noticed kids are not as involved in Christmas.  He is sleeping okay but sometimes he feels the Klonopin does not work as good.  He still feels nervous and anxious.  He is compliant with Lamictal and Viibryd.  Denies any highs and lows or any mood swings.  He tried to keep himself busy in his work.  His relationship with the girlfriend is going well.  He is not interested in therapy.  His appetite is okay.  His weight is unchanged from the past.   Past Psychiatric History:Reviewed. H/Oirritability, impulsive behavior and depression. No h/oinpatient or suicidal attempt. [facility] with [facility] treatment. Saw Dr. Toy Cookey  and tried Prozac, Ambien, Cymbalta, Paxil, Wellbutrin, Lexapro, Seroquel and Klonopin. H/Osexual side effects with most of psychotropic medication. Had GeneSight testing shows Viibryd, Zoloft, [place], [place], Klonopin, [place], Lunesta and Ambien arebetterchoice.  Psychiatric Specialty Exam: Physical Exam  Review of Systems  Weight 166 lb (75.3 kg).There is no height or weight on file to calculate BMI.  General Appearance: NA  Eye Contact:  NA  Speech:  Slow  Volume:  Normal  Mood:  Anxious and Dysphoric  Affect:  NA  Thought Process:  Goal Directed  Orientation:  Full (Time, Place, and Person)  Thought Content:  Rumination  Suicidal Thoughts:  No  Homicidal Thoughts:  No  Memory:  Immediate;   Good Recent;   Good Remote;   Good  Judgement:  Intact  Insight:  Present  Psychomotor Activity:  NA  Concentration:  Concentration: Good and Attention Span: Good  Recall:  Good  Fund of Knowledge:  Good  Language:  Good  Akathisia:  No  Handed:  Right  AIMS (if indicated):     Assets:  Communication Skills Desire for Improvement Housing Resilience Social Support Talents/Skills Transportation  ADL's:  Intact  Cognition:  WNL  Sleep:   fair      Assessment and Plan: Bipolar disorder type II.  Generalized anxiety disorder.  Patient has not started taking Pristiq.  He is taking his medication but sometimes the Klonopin is not strong has struggled with insomnia.  I recommend to try Klonopin half to 1  tablet however hoping that new medication Pristiq 25 mg will help it.  Recommended to call us back if there is any question or any concern.  Follow-up in 6 weeks.  Discussed medication side effects and benefits.  Follow Up Instructions:    I discussed the assessment and treatment plan with the patient. The patient was provided an opportunity to ask questions and all were answered. The patient agreed with the plan and demonstrated an understanding of the instructions.   The  patient was advised to call back or seek an in-person evaluation if the symptoms worsen or if the condition fails to improve as anticipated.  I provided 8 minutes of non-face-to-face time during this encounter.   Kathlee Nations, MD

## 2020-11-29 ENCOUNTER — Telehealth (HOSPITAL_COMMUNITY): Payer: Self-pay

## 2020-11-29 NOTE — Telephone Encounter (Signed)
Medication management - Telephone call with pt after he had left a message about a refill for his Clonazepam and Lamictal. Pt. stated his pharmacy had the Lamictal ready and informed pt by last order for his Clonazepam on 10/21/20 he should have a refill.  Reminded patient about upcoming appointment with Dr. Adele Schilder set for 12/05/20 at 4:20pm and patient to call back if any problems getting orders filled.

## 2020-12-05 ENCOUNTER — Other Ambulatory Visit: Payer: Self-pay

## 2020-12-05 ENCOUNTER — Telehealth (INDEPENDENT_AMBULATORY_CARE_PROVIDER_SITE_OTHER): Payer: 59 | Admitting: Psychiatry

## 2020-12-05 ENCOUNTER — Encounter (HOSPITAL_COMMUNITY): Payer: Self-pay | Admitting: Psychiatry

## 2020-12-05 DIAGNOSIS — F3181 Bipolar II disorder: Secondary | ICD-10-CM

## 2020-12-05 DIAGNOSIS — F411 Generalized anxiety disorder: Secondary | ICD-10-CM

## 2020-12-05 MED ORDER — LAMOTRIGINE 150 MG PO TABS: 300.0000 mg | ORAL_TABLET | Freq: Every day | ORAL | 1 refills | Status: DC

## 2020-12-05 MED ORDER — CLONAZEPAM 1 MG PO TABS: ORAL_TABLET | ORAL | 1 refills | Status: DC

## 2020-12-05 MED ORDER — VIIBRYD 20 MG PO TABS: 20.0000 mg | ORAL_TABLET | Freq: Every day | ORAL | 0 refills | Status: DC

## 2020-12-05 NOTE — Progress Notes (Signed)
Virtual Visit via Telephone Note  I connected with Jermaine Kidd on 12/05/20 at  4:20 PM EST by telephone and verified that I am speaking with the correct person using two identifiers.  Location: Patient: Work Provider: [employer]   I discussed the limitations, risks, security and privacy concerns of performing an evaluation and management service by telephone and the availability of in person appointments. I also discussed with the patient that there may be a patient responsible charge related to this service. The patient expressed understanding and agreed to proceed.   History of Present Illness: Patient is evaluated by phone session.  He again forgot to take Pristiq but overall he is sleeping better since he is taking 1 mg of Klonopin at nighttime.  He is no longer taking gabapentin which was making him dizziness.  He is still have some anxiety and dysphoric mood during the day and sometimes he gets very nervous but did not know the reason.  His son and dad has a COVID symptoms.  He only saw 2-3 times since Christmas his daughter who is at [facility] state.  He is disappointed because his girlfriend who supposed to go to [place] for [employer] and now plan is been completely canceled.  He denies any crying spells or any feeling of hopelessness.  He denies any highs and lows or any mood swings.  However he does noted getting sometimes frustrated and irritable.  Denies any suicidal thoughts or homicidal thoughts.  His relationship with the girlfriend is going very well.  He is not interested in therapy.  He does not want to try any medication that caused sexual side effects.  His appetite is okay and his weight is stable and he denies any either weight gain or weight loss since the last visit.   Past Psychiatric History:Reviewed. H/Oirritability, impulsive behavior and depression. No h/oinpatient or suicidal attempt. [facility] treatment. Saw Dr.  Toy Cookey and tried Prozac, Ambien, Cymbalta, Paxil, Wellbutrin, Lexapro, Seroquel and Klonopin. H/Osexual side effects with most of psychotropic medication. Had GeneSight testing shows Viibryd, Zoloft, [place], [place], Klonopin, [place], Lunesta and Ambien arebetterchoice. Gabapentin caused dizziness.  Psychiatric Specialty Exam: Physical Exam  Review of Systems  Weight 166 lb (75.3 kg).There is no height or weight on file to calculate BMI.  General Appearance: NA  Eye Contact:  NA  Speech:  Normal Rate  Volume:  Normal  Mood:  Dysphoric  Affect:  NA  Thought Process:  Goal Directed  Orientation:  Full (Time, Place, and Person)  Thought Content:  Rumination  Suicidal Thoughts:  No  Homicidal Thoughts:  No  Memory:  Immediate;   Good Recent;   Good Remote;   Good  Judgement:  Fair  Insight:  Shallow  Psychomotor Activity:  NA  Concentration:  Concentration: Fair and Attention Span: Fair  Recall:  Good  Fund of Knowledge:  Good  Language:  Good  Akathisia:  No  Handed:  Right  AIMS (if indicated):     Assets:  Communication Skills Desire for Improvement Housing Resilience Social Support Talents/Skills Transportation  ADL's:  Intact  Cognition:  WNL  Sleep:   ok      Assessment and Plan: Bipolar disorder type II.  Generalized anxiety disorder.  Patient again did not started Pristiq but feeling better since the Klonopin 1 mg at bedtime.  He still have some residual anxiety and dysphoric mood during the day.  I recommend he can try  taking half Klonopin during the day and keep the Klonopin 1 mg at bedtime.  We talked about benzodiazepine dependence tolerance and withdrawal.  He has no rash or any itching from other medication.  We will continue Lamictal 300 mg daily and Viibryd 20 mg daily.  Since he had not started Pristiq I will not renew the medication.  Recommended to call us back if is any question or any concern.  Follow-up in 2 months.  Follow Up Instructions:     I discussed the assessment and treatment plan with the patient. The patient was provided an opportunity to ask questions and all were answered. The patient agreed with the plan and demonstrated an understanding of the instructions.   The patient was advised to call back or seek an in-person evaluation if the symptoms worsen or if the condition fails to improve as anticipated.  I provided 17 minutes of non-face-to-face time during this encounter.   Kathlee Nations, MD

## 2020-12-16 ENCOUNTER — Telehealth (HOSPITAL_COMMUNITY): Payer: Self-pay | Admitting: *Deleted

## 2020-12-16 NOTE — Telephone Encounter (Signed)
I returned patient's phone call.  He recently had a break-up with his girlfriend.  He is not sure why she break-up but he has been very sad depressed but denies any suicidal thoughts.  He like to be considered taking Pristiq 25 mg which he has not started yet.  I agree with the plan.  Recommend to continue all other medication as prescribed.  I also recommend if symptoms do not improve then he can call us back.

## 2020-12-16 NOTE — Telephone Encounter (Signed)
Patient called and stated he and his girlfriend broke up last week and now his depression has increased and he is having panic attacks.  He is not suicidal.  He wanted to talk personally to you as he said he had in the past.  He did not want a sooner apt which I suggested.  He would not discuss any med changes with me and said he needed to talk with you.

## 2021-01-22 ENCOUNTER — Other Ambulatory Visit: Payer: Self-pay

## 2021-01-22 ENCOUNTER — Telehealth (INDEPENDENT_AMBULATORY_CARE_PROVIDER_SITE_OTHER): Payer: 59 | Admitting: Psychiatry

## 2021-01-22 DIAGNOSIS — F3181 Bipolar II disorder: Secondary | ICD-10-CM

## 2021-01-22 DIAGNOSIS — F411 Generalized anxiety disorder: Secondary | ICD-10-CM

## 2021-01-22 MED ORDER — CLONAZEPAM 1 MG PO TABS
ORAL_TABLET | ORAL | 1 refills | Status: DC
Start: 2021-01-22 — End: 2021-04-08

## 2021-01-22 MED ORDER — DESVENLAFAXINE SUCCINATE ER 25 MG PO TB24: 25.0000 mg | ORAL_TABLET | Freq: Every day | ORAL | 1 refills | Status: DC

## 2021-01-22 MED ORDER — LAMOTRIGINE 150 MG PO TABS: 300.0000 mg | ORAL_TABLET | Freq: Every day | ORAL | 1 refills | Status: DC

## 2021-01-22 MED ORDER — VIIBRYD 20 MG PO TABS: 20.0000 mg | ORAL_TABLET | Freq: Every day | ORAL | 0 refills | Status: DC

## 2021-01-22 NOTE — Progress Notes (Signed)
Virtual Visit via Telephone Note  I connected with [place] on 01/22/21 at  4:20 PM EDT by telephone and verified that I am speaking with the correct person using two identifiers.  Location: Patient: Home Provider: Home Office   I discussed the limitations, risks, security and privacy concerns of performing an evaluation and management service by telephone and the availability of in person appointments. I also discussed with the patient that there may be a patient responsible charge related to this service. The patient expressed understanding and agreed to proceed.   History of Present Illness: Patient is evaluated by phone session.  He had called earlier that he had a break-up with his girlfriend and like to restart Pristiq.  However he did not pick up the medication Pristiq and remain on Viibryd, Klonopin and Lamictal.  Today he mentioned that he is back in relationship and things are going okay for now.  However he is sad because his best friend diagnosed with brain tumor and may not have good prognosis.  He is very sad about his friend.  He is also very concerned about his 35 year old son who has depression and he has to stop school because of depression.  Patient told he tried to admit him in the hospital but that did not happen.  Patient denies any mania, psychosis or any hallucination.  Denies any aggression of violence but gets sometimes frustrated and irritable a lot of ruminative and negative thoughts.  He is not interested in therapy as I ask again.  He is okay to go back to consider trying Pristiq which he never picked up recently and never tried before.  His appetite is okay.  He like Klonopin which is helping his sleep and racing thoughts at nighttime.  He has not checked his weight but reported his weight is stable.  He denies any nightmares or any flashbacks.   Past Psychiatric History:Reviewed. H/Oirritability, impulsive behavior and depression. No h/oinpatient or suicidal  attempt. [facility]. Saw Dr. Toy Cookey and tried Prozac, Ambien, Cymbalta, Paxil, Wellbutrin, Lexapro, Seroquel and Klonopin. H/Osexual side effects with most of psychotropic medication. Had GeneSight testing shows Viibryd, Zoloft, [place], [place], Klonopin, [place], Lunesta and Ambien arebetterchoice. Gabapentin caused dizziness.  Psychiatric Specialty Exam: Physical Exam  Review of Systems  There were no vitals taken for this visit.There is no height or weight on file to calculate BMI.  General Appearance: NA  Eye Contact:  NA  Speech:  Clear and Coherent  Volume:  Decreased  Mood:  Dysphoric  Affect:  NA  Thought Process:  Descriptions of Associations: Intact  Orientation:  Full (Time, Place, and Person)  Thought Content:  Rumination  Suicidal Thoughts:  No  Homicidal Thoughts:  No  Memory:  Immediate;   Good Recent;   Good Remote;   Good  Judgement:  Fair  Insight:  Shallow  Psychomotor Activity:  NA  Concentration:  Concentration: Fair and Attention Span: Good  Recall:  Good  Fund of Knowledge:  Good  Language:  Good  Akathisia:  No  Handed:  Right  AIMS (if indicated):     Assets:  Communication Skills Desire for Improvement Housing Talents/Skills Transportation  ADL's:  Intact  Cognition:  WNL  Sleep:   ok     Assessment and Plan: Bipolar disorder type II.  Generalized anxiety disorder.  Patient now considering to pick up the Pristiq and like to the new prescription.  He did not  pick up when he called earlier.  He is back in his relationship but sad about his son who has depression and his best friend who recently diagnosed with brain tumor.  Like to keep his current medication with addition of Pristiq.  So far he has no side effects from the medication.  I will continue Lamictal 300 mg daily, Viibryd 20 mg daily, Klonopin 1 mg at bedtime and start Pristiq 25 mg daily.  Recommended to call us back if is any  question or any concern.  Follow-up in 2 months.  Follow Up Instructions:    I discussed the assessment and treatment plan with the patient. The patient was provided an opportunity to ask questions and all were answered. The patient agreed with the plan and demonstrated an understanding of the instructions.   The patient was advised to call back or seek an in-person evaluation if the symptoms worsen or if the condition fails to improve as anticipated.  I provided 14 minutes of non-face-to-face time during this encounter.   Kathlee Nations, MD

## 2021-04-08 ENCOUNTER — Other Ambulatory Visit: Payer: Self-pay

## 2021-04-08 ENCOUNTER — Telehealth (INDEPENDENT_AMBULATORY_CARE_PROVIDER_SITE_OTHER): Payer: 59 | Admitting: Psychiatry

## 2021-04-08 DIAGNOSIS — F411 Generalized anxiety disorder: Secondary | ICD-10-CM | POA: Diagnosis not present

## 2021-04-08 DIAGNOSIS — F3181 Bipolar II disorder: Secondary | ICD-10-CM | POA: Diagnosis not present

## 2021-04-08 MED ORDER — CLONAZEPAM 1 MG PO TABS
ORAL_TABLET | ORAL | 1 refills | Status: DC
Start: 2021-04-08 — End: 2021-07-14

## 2021-04-08 MED ORDER — LAMOTRIGINE 150 MG PO TABS: 300.0000 mg | ORAL_TABLET | Freq: Every day | ORAL | 2 refills | Status: DC

## 2021-04-08 MED ORDER — VILAZODONE HCL 20 MG PO TABS: 20.0000 mg | ORAL_TABLET | Freq: Every day | ORAL | 0 refills | Status: DC

## 2021-04-08 NOTE — Progress Notes (Signed)
Virtual Visit via Telephone Note  I connected with Jermaine Kidd on 04/08/21 at  3:40 PM EDT by telephone and verified that I am speaking with the correct person using two identifiers.  Location: Patient: [place] Provider: Home Office   I discussed the limitations, risks, security and privacy concerns of performing an evaluation and management service by telephone and the availability of in person appointments. I also discussed with the patient that there may be a patient responsible charge related to this service. The patient expressed understanding and agreed to proceed.   History of Present Illness: Patient is evaluated by phone session.  Currently he is in [place] with his girlfriend who is attending in conference.  Patient told he continues to have these anxiety and nervousness because he is not sure about his future of the relationship.  He is back with a girlfriend but she has not proceed with divorce.  Patient told she is separated from her husband for more than a year and despite multiple time having a discussion she has not seek divorce.  He admitted getting sometimes frustrated and irritable but wants to give little more time.  He has upcoming vacation time with a girlfriend is going on a cruise line and if he does not see any future then he may end the relationship.  He is sleeping good with the medication.  He is happy that his 54 year-old son is now better and back in the school.  He is going to [place] on August 4 for 3 weeks for study.  Patient never picked up the Pristiq because he felt he does not needed.  He still does not feel he needs to add more medication as he feels the Klonopin Lamictal and Viibryd is helping and his symptoms are manageable.  Sometimes he does not take the Klonopin in the morning but he does take it at night which helps his sleep.  He reported his energy level is good.  His appetite is okay.  His close friend who had diagnosed with brain tumor is now getting  radiation.  Patient denies any tremors, shakes or any EPS.  His appetite is okay and his weight is unchanged from the past.  Patient denies any mania, psychosis, hallucination thoughts.   Past Psychiatric History: Reviewed. H/O irritability, impulsive behavior and depression.  No h/o inpatient or suicidal attempt.  [facility].  Saw Dr. Toy Cookey and tried Prozac, Ambien, Cymbalta, Paxil, Wellbutrin, Lexapro, Seroquel and Klonopin.  H/O sexual side effects with most of psychotropic medication. Had GeneSight testing shows Viibryd, Zoloft, [place], [place], [place], [place], [place] and Ambien are better choice.  Gabapentin caused dizziness.   Psychiatric Specialty Exam: Physical Exam  Review of Systems  There were no vitals taken for this visit.There is no height or weight on file to calculate BMI.  General Appearance: NA  Eye Contact:  NA  Speech:  Normal Rate  Volume:  Normal  Mood:  Anxious and Dysphoric  Affect:  NA  Thought Process:  Goal Directed  Orientation:  Full (Time, Place, and Person)  Thought Content:  Rumination  Suicidal Thoughts:  No  Homicidal Thoughts:  No  Memory:  Immediate;   Good Recent;   Good Remote;   Good  Judgement:  Intact  Insight:  Shallow  Psychomotor Activity:  NA  Concentration:  Concentration: Good and Attention Span: Good  Recall:  Good  Fund of Knowledge:  Good  Language:  Good  Akathisia:  No  Handed:  Right  AIMS (if indicated):     Assets:  Communication Skills Desire for Improvement Housing Talents/Skills Transportation  ADL's:  Intact  Cognition:  WNL  Sleep:   ok      Assessment and Plan: Generalized anxiety disorder.  Bipolar disorder type II.  Patient never picked up the Pristiq even though he said he will try.  We like to keep the current medicine which is Lamictal 300 mg daily, Viibryd 20 mg daily and Klonopin 1 mg at bedtime and sometimes he takes half tablet in the  morning.  He is not sure about his relationship would like to give more time.  We discussed medication side effects and benefits.  He is not interested in therapy.  I recommend to call us back if is any question or any concern.  Follow-up in 3 months.  Follow Up Instructions:    I discussed the assessment and treatment plan with the patient. The patient was provided an opportunity to ask questions and all were answered. The patient agreed with the plan and demonstrated an understanding of the instructions.   The patient was advised to call back or seek an in-person evaluation if the symptoms worsen or if the condition fails to improve as anticipated.  I provided 17 minutes of non-face-to-face time during this encounter.   Kathlee Nations, MD

## 2021-04-14 ENCOUNTER — Encounter: Payer: Self-pay | Admitting: Family

## 2021-07-09 ENCOUNTER — Telehealth (HOSPITAL_COMMUNITY): Payer: 59 | Admitting: Psychiatry

## 2021-07-14 ENCOUNTER — Telehealth (HOSPITAL_BASED_OUTPATIENT_CLINIC_OR_DEPARTMENT_OTHER): Payer: 59 | Admitting: Psychiatry

## 2021-07-14 ENCOUNTER — Encounter (HOSPITAL_COMMUNITY): Payer: Self-pay | Admitting: Psychiatry

## 2021-07-14 ENCOUNTER — Other Ambulatory Visit: Payer: Self-pay

## 2021-07-14 DIAGNOSIS — F3181 Bipolar II disorder: Secondary | ICD-10-CM

## 2021-07-14 DIAGNOSIS — F411 Generalized anxiety disorder: Secondary | ICD-10-CM | POA: Diagnosis not present

## 2021-07-14 MED ORDER — LAMOTRIGINE 150 MG PO TABS: 300.0000 mg | ORAL_TABLET | Freq: Every day | ORAL | 2 refills | Status: DC

## 2021-07-14 MED ORDER — VILAZODONE HCL 20 MG PO TABS: 20.0000 mg | ORAL_TABLET | Freq: Every day | ORAL | 0 refills | Status: DC

## 2021-07-14 MED ORDER — CLONAZEPAM 1 MG PO TABS: ORAL_TABLET | ORAL | 1 refills | Status: DC

## 2021-07-14 NOTE — Progress Notes (Signed)
Virtual Visit via Telephone Note  I connected with Jermaine Kidd on 07/14/21 at  3:20 PM EDT by telephone and verified that I am speaking with the correct person using two identifiers.  Location: Patient: Work Provider: Biomedical scientist   I discussed the limitations, risks, security and privacy concerns of performing an evaluation and management service by telephone and the availability of in person appointments. I also discussed with the patient that there may be a patient responsible charge related to this service. The patient expressed understanding and agreed to proceed.   History of Present Illness: Patient is evaluated by phone session.  He reported things are going very well with him and he has been very busy traveling.  His relationship is also going very well and lately no issues or concerns.  He has been taking his medication regularly.  He had visit to [place] than [place] and now again going next week to [place].  He is sleeping good.  He denies any anger, mood swings, irritability or any crying spells.  He does not have negative or ruminative thoughts and he feels finally the medicine seems to be working very well.  His appetite is okay.  His weight is stable.  He like to keep his current medication.  He has no tremors, shakes or any EPS.  He has no rash or any itching.  Past Psychiatric History: Reviewed. H/O irritability, impulsive behavior and depression.  No h/o inpatient or suicidal attempt.  [facility].  Saw Dr. Toy Cookey and tried Prozac, Ambien, Cymbalta, Paxil, Wellbutrin, Lexapro, Seroquel and Klonopin.  H/O sexual side effects with most of psychotropic medication. Had GeneSight testing shows Viibryd, Zoloft, [place], [place], [place], [place], [place] and Ambien are better choice.  Gabapentin caused dizziness.    Psychiatric Specialty Exam: Physical Exam  Review of Systems  Weight 171 lb (77.6 kg).There is no height or weight  on file to calculate BMI.  General Appearance: NA  Eye Contact:  NA  Speech:  Clear and Coherent  Volume:  Normal  Mood:  Euthymic  Affect:  NA  Thought Process:  Goal Directed  Orientation:  Full (Time, Place, and Person)  Thought Content:  WDL  Suicidal Thoughts:  No  Homicidal Thoughts:  No  Memory:  Immediate;   Good Recent;   Good Remote;   Good  Judgement:  Intact  Insight:  Present  Psychomotor Activity:  NA  Concentration:  Concentration: Good and Attention Span: Good  Recall:  Good  Fund of Knowledge:  Good  Language:  Good  Akathisia:  No  Handed:  Right  AIMS (if indicated):     Assets:  Communication Skills Desire for [place] Talents/Skills Transportation  ADL's:  Intact  Cognition:  WNL  Sleep:   ok      Assessment and Plan: Generalized anxiety disorder.  Bipolar disorder type II.  Patient is stable on Lamictal, Viibryd and Klonopin.  He does not want to change his medication as he feels things are going very well.  He is also happy as relationship is also going very well.  Continue Viibryd 20 mg daily, Klonopin 1 mg at bedtime and sometimes he takes half tablet in the morning and continue lamotrigine 300 mg daily.  He has no rash or any itching.  Recommended to call us back if is any question or any concern.  Follow-up in 3 months.  Follow Up Instructions:  I discussed the assessment and treatment plan with the patient. The patient was provided an opportunity to ask questions and all were answered. The patient agreed with the plan and demonstrated an understanding of the instructions.   The patient was advised to call back or seek an in-person evaluation if the symptoms worsen or if the condition fails to improve as anticipated.  I provided 18 minutes of non-face-to-face time during this encounter.   Kathlee Nations, MD

## 2021-10-14 ENCOUNTER — Telehealth (HOSPITAL_COMMUNITY): Payer: 59 | Admitting: Psychiatry

## 2021-10-14 ENCOUNTER — Other Ambulatory Visit: Payer: Self-pay

## 2021-10-22 ENCOUNTER — Other Ambulatory Visit (HOSPITAL_COMMUNITY): Payer: Self-pay | Admitting: Psychiatry

## 2021-10-22 DIAGNOSIS — F411 Generalized anxiety disorder: Secondary | ICD-10-CM

## 2021-10-23 ENCOUNTER — Telehealth (HOSPITAL_BASED_OUTPATIENT_CLINIC_OR_DEPARTMENT_OTHER): Payer: 59 | Admitting: Psychiatry

## 2021-10-23 ENCOUNTER — Other Ambulatory Visit: Payer: Self-pay

## 2021-10-23 ENCOUNTER — Encounter (HOSPITAL_COMMUNITY): Payer: Self-pay | Admitting: Psychiatry

## 2021-10-23 DIAGNOSIS — F411 Generalized anxiety disorder: Secondary | ICD-10-CM

## 2021-10-23 DIAGNOSIS — F3181 Bipolar II disorder: Secondary | ICD-10-CM | POA: Diagnosis not present

## 2021-10-23 MED ORDER — CLONAZEPAM 1 MG PO TABS: ORAL_TABLET | ORAL | 1 refills | Status: DC

## 2021-10-23 MED ORDER — LAMOTRIGINE 150 MG PO TABS: 300.0000 mg | ORAL_TABLET | Freq: Every day | ORAL | 2 refills | Status: DC

## 2021-10-23 MED ORDER — VILAZODONE HCL 20 MG PO TABS: 20.0000 mg | ORAL_TABLET | Freq: Every day | ORAL | 0 refills | Status: DC

## 2021-10-23 NOTE — Progress Notes (Signed)
Virtual Visit via Telephone Note  I connected with Jermaine Kidd on 10/23/21 at  3:40 PM EST by telephone and verified that I am speaking with the correct person using two identifiers.  Location: Patient: Work Provider: Biomedical scientist   I discussed the limitations, risks, security and privacy concerns of performing an evaluation and management service by telephone and the availability of in person appointments. I also discussed with the patient that there may be a patient responsible charge related to this service. The patient expressed understanding and agreed to proceed.   History of Present Illness: Patient is evaluated by phone session.  He told things are going well.  He had a cruise trip and now in 2 weeks he is going to [place].  His relationship going very well and he feels difference in his mood.  He denies any mania, psychosis, hallucination.  His energy level is good.  He continues to go to gym and taking his medication as prescribed.  He has not taken Klonopin in the daytime but still takes at night which helps his anxiety and sleep.  He has no tremors, shakes or any EPS.  Denies any ruminative thoughts or any panic attack.  He like his current medication.  His appetite is okay and his weight is stable.  Past Psychiatric History: Reviewed. H/O irritability, impulsive behavior and depression.  No h/o inpatient or suicidal attempt.  [facility].  Saw Dr. Toy Cookey and tried Prozac, Ambien, Cymbalta, Paxil, Wellbutrin, Lexapro, Seroquel and Klonopin.  H/O sexual side effects with most of psychotropic medication. Had GeneSight testing shows Viibryd, Zoloft, [place], [place], [place], [place], [place] and Ambien are better choice.  Gabapentin caused dizziness.   Psychiatric Specialty Exam: Physical Exam  Review of Systems  Weight 171 lb (77.6 kg).There is no height or weight on file to calculate BMI.  General Appearance: NA  Eye Contact:   NA  Speech:  Clear and Coherent and Normal Rate  Volume:  Normal  Mood:  Euthymic  Affect:  NA  Thought Process:  Goal Directed  Orientation:  Full (Time, Place, and Person)  Thought Content:  Logical  Suicidal Thoughts:  No  Homicidal Thoughts:  No  Memory:  Immediate;   Good Recent;   Good Remote;   Good  Judgement:  Good  Insight:  Present  Psychomotor Activity:  NA  Concentration:  Concentration: Good and Attention Span: Good  Recall:  Good  Fund of Knowledge:  Good  Language:  Good  Akathisia:  No  Handed:  Right  AIMS (if indicated):     Assets:  Communication Skills Desire for Improvement Housing Social Support Talents/Skills Transportation  ADL's:  Intact  Cognition:  WNL  Sleep:   6 hrs      Assessment and Plan: Generalized anxiety disorder.  Bipolar disorder type II.  Patient mood is a stable on his current medication.  Denies any anger, severe mood swings.  His relationship is going well.  Continue Viibryd 20 mg daily, Klonopin 1 mg at bedtime and rarely he takes half tablet in the morning and continue Lamictal 300 mg daily.  Recommended to call us back if is any question or any concern.  Follow-up in 3 months.  Follow Up Instructions:    I discussed the assessment and treatment plan with the patient. The patient was provided an opportunity to ask questions and all were answered. The patient agreed with the plan and demonstrated an understanding  of the instructions.   The patient was advised to call back or seek an in-person evaluation if the symptoms worsen or if the condition fails to improve as anticipated.  I provided 17 minutes of non-face-to-face time during this encounter.   Kathlee Nations, MD

## 2021-10-24 ENCOUNTER — Telehealth (HOSPITAL_COMMUNITY): Payer: Self-pay | Admitting: *Deleted

## 2021-10-24 ENCOUNTER — Other Ambulatory Visit (HOSPITAL_COMMUNITY): Payer: Self-pay | Admitting: *Deleted

## 2021-10-24 DIAGNOSIS — F411 Generalized anxiety disorder: Secondary | ICD-10-CM

## 2021-10-24 MED ORDER — VIIBRYD STARTER PACK 10 & 20 MG PO KIT: 20.0000 mg | PACK | Freq: Every day | ORAL | 0 refills | Status: DC

## 2021-10-24 MED ORDER — VIIBRYD 20 MG PO TABS: 20.0000 mg | ORAL_TABLET | Freq: Every day | ORAL | 0 refills | Status: DC

## 2021-10-24 NOTE — Telephone Encounter (Signed)
Thanks

## 2021-10-24 NOTE — Telephone Encounter (Signed)
Pt called asking that Viibryd 20 mg Rx be resent to pharmacy as name brand only as with his insurance he pays $10 for brand name and $130 for generic. Order placed. Pt also mentioned that his pharmacy is currently out of stock of the name brand and he is completely out of medication and has been for a few days. Nurse encouraged pt to come in and pick up samples, which he did. Pt appreciative.

## 2021-12-24 ENCOUNTER — Other Ambulatory Visit (HOSPITAL_COMMUNITY): Payer: Self-pay | Admitting: Psychiatry

## 2021-12-24 DIAGNOSIS — F3181 Bipolar II disorder: Secondary | ICD-10-CM

## 2021-12-31 ENCOUNTER — Other Ambulatory Visit (HOSPITAL_COMMUNITY): Payer: Self-pay | Admitting: *Deleted

## 2021-12-31 ENCOUNTER — Telehealth (HOSPITAL_COMMUNITY): Payer: Self-pay | Admitting: *Deleted

## 2021-12-31 DIAGNOSIS — F3181 Bipolar II disorder: Secondary | ICD-10-CM

## 2021-12-31 MED ORDER — LAMOTRIGINE 150 MG PO TABS: 300.0000 mg | ORAL_TABLET | Freq: Every day | ORAL | 0 refills | Status: DC

## 2021-12-31 NOTE — Telephone Encounter (Signed)
Pt called requesting a refill on the Lamictal 150 mg (300 mg total). It appears in the EMR that pt should have had a fill this month however upon speaking with [facility], [address], pharmacist stated that the last refill was "dropped"? Last fill date 11/29/21. Nurse send #[phone] as a bridge to next appointment. FYI. ?

## 2022-01-13 DIAGNOSIS — B079 Viral wart, unspecified: Secondary | ICD-10-CM | POA: Diagnosis not present

## 2022-01-13 DIAGNOSIS — L821 Other seborrheic keratosis: Secondary | ICD-10-CM | POA: Diagnosis not present

## 2022-01-15 DIAGNOSIS — M542 Cervicalgia: Secondary | ICD-10-CM | POA: Diagnosis not present

## 2022-01-22 ENCOUNTER — Encounter (HOSPITAL_COMMUNITY): Payer: Self-pay | Admitting: Psychiatry

## 2022-01-22 ENCOUNTER — Telehealth (HOSPITAL_BASED_OUTPATIENT_CLINIC_OR_DEPARTMENT_OTHER): Payer: 59 | Admitting: Psychiatry

## 2022-01-22 DIAGNOSIS — F3181 Bipolar II disorder: Secondary | ICD-10-CM

## 2022-01-22 DIAGNOSIS — F411 Generalized anxiety disorder: Secondary | ICD-10-CM

## 2022-01-22 MED ORDER — LAMOTRIGINE 150 MG PO TABS: 300.0000 mg | ORAL_TABLET | Freq: Every day | ORAL | 1 refills | Status: DC

## 2022-01-22 MED ORDER — CLONAZEPAM 1 MG PO TABS: ORAL_TABLET | ORAL | 5 refills | Status: DC

## 2022-01-22 MED ORDER — VIIBRYD 20 MG PO TABS: 20.0000 mg | ORAL_TABLET | Freq: Every day | ORAL | 1 refills | Status: DC

## 2022-01-22 NOTE — Progress Notes (Signed)
Virtual Visit via Telephone Note ? ?I connected with Daisy Blossom Borowiak on 01/22/22 at  3:40 PM EDT by telephone and verified that I am speaking with the correct person using two identifiers. ? ?Location: ?Patient: Home ?Provider: Office ?  ?I discussed the limitations, risks, security and privacy concerns of performing an evaluation and management service by telephone and the availability of in person appointments. I also discussed with the patient that there may be a patient responsible charge related to this service. The patient expressed understanding and agreed to proceed. ? ? ?History of Present Illness: ?Patient is evaluated by phone session.  Today he is at his father's property and trying to fix few things.  He feels the medicine is working.  He is not sure about his relationship because he was hoping that she will make a decision as she is separated for 2 years but have not given the divorce yet.  Patient sometimes feels that she may have to come out of this relationship because it is not going anywhere.  Overall his work is going well.  His mood is stable.  He denies any anger, mania, agitation.  He continues to go to gym and take his medication as prescribed.  Recently he has changed his insurance which covers brand name Viibryd.  He is taking Klonopin only at bedtime.  He has no tremors, shakes, rash or any itching.  He denies any suicidal thoughts.  His appetite is okay and his weight is stable.  He has not seen his PCP but like to schedule appointment and to have physical soon. ? ? ?Past Psychiatric History: Reviewed. ?H/O irritability, impulsive behavior and depression.  No h/o inpatient or suicidal attempt.  [facility].  Saw Dr. Toy Cookey and tried Prozac, Ambien, Cymbalta, Paxil, Wellbutrin, Lexapro, Seroquel and Klonopin.  H/O sexual side effects with most of psychotropic medication. Had GeneSight testing shows Viibryd, Zoloft, [place], [place],  [place], [place], [place] and Ambien are better choice.  Gabapentin caused dizziness. ? ?Psychiatric Specialty Exam: ?Physical Exam  ?Review of Systems  ?Weight 175 lb (79.4 kg).There is no height or weight on file to calculate BMI.  ?General Appearance: NA  ?Eye Contact:  NA  ?Speech:  Normal Rate  ?Volume:  Normal  ?Mood:  Euthymic  ?Affect:  NA  ?Thought Process:  Goal Directed  ?Orientation:  Full (Time, Place, and Person)  ?Thought Content:  Logical  ?Suicidal Thoughts:  No  ?Homicidal Thoughts:  No  ?Memory:  Immediate;   Good ?Recent;   Good ?Remote;   Good  ?Judgement:  Good  ?Insight:  Present  ?Psychomotor Activity:  NA  ?Concentration:  Concentration: Good and Attention Span: Good  ?Recall:  Good  ?Fund of Knowledge:  Good  ?Language:  Good  ?Akathisia:  No  ?Handed:  Right  ?AIMS (if indicated):     ?Assets:  Communication Skills ?Desire for Improvement ?Housing ?Resilience ?Transportation  ?ADL's:  Intact  ?Cognition:  WNL  ?Sleep:   6 hrs  ? ? ? ? ?Assessment and Plan: ?Generalized anxiety disorder.  Bipolar disorder type II. ? ?Patient is stable on his current medication.  Continue Viibryd 20 mg daily, Klonopin 1 mg at bedtime and Lamictal 300 mg daily.  Discussed medication side effects and benefits.  Recommended to schedule appointment with PCP for physical and blood work.  Follow up in 6 months. ? ?Follow Up Instructions: ? ?  ?I discussed the assessment and treatment  plan with the patient. The patient was provided an opportunity to ask questions and all were answered. The patient agreed with the plan and demonstrated an understanding of the instructions. ?  ?The patient was advised to call back or seek an in-person evaluation if the symptoms worsen or if the condition fails to improve as anticipated. ? ?Collaboration of Care: Primary Care Provider AEB make appointment for blood work and physical with PCP ? ?Patient/Guardian was advised Release of Information must be obtained prior to any record  release in order to collaborate their care with an outside provider. Patient/Guardian was advised if they have not already done so to contact the registration department to sign all necessary forms in order for [place] to release information regarding their care.  ? ?Consent: Patient/Guardian gives verbal consent for treatment and assignment of benefits for services provided during this visit. Patient/Guardian expressed understanding and agreed to proceed.   ? ?I provided 11 minutes of non-face-to-face time during this encounter. ? ? ?Kathlee Nations, MD  ?

## 2022-02-02 ENCOUNTER — Telehealth (HOSPITAL_COMMUNITY): Payer: Self-pay

## 2022-02-02 NOTE — Telephone Encounter (Signed)
S/W Gerald Stabs at [employer] [facility]. Writer had received a fax stating that they needed additional information for pt's PA on his Viibryd '20mg'$ . Writer answered the additional questions over the phone w/Chris from [facility].  ?CHRIS HAS SENT THE PA FOR FURTHER REVIEW. STILL WAITING ON DETERMINATION. ?

## 2022-02-04 ENCOUNTER — Telehealth (HOSPITAL_COMMUNITY): Payer: Self-pay

## 2022-02-04 NOTE — Telephone Encounter (Addendum)
BLUE CROSS BLUE SHIELD OF [place] PRESCRIPTION COVERAGE ?DENIED  VIIBRYD 20MG TABLET ? ?"EXPLANATION OF BASIS FOR DETERMINATION:" ?"THE REQUEST FOR COVERAGE OF BRAND VIIBRYD IS DENIED. THIS MEDICATION IS NOT ON THE FORMULARY AND NONE OF THE CRITERIA ARE MET BY THE PATIENT." ?"REASON FOR DENIAL: THE REQUEST DOES NOT MEET THE DEFINITION OF MEDICAL NECESSITY FOUND IN THE MEMBER'S BENEFIT BOOKLET." ?

## 2022-02-04 NOTE — Telephone Encounter (Signed)
Ok

## 2022-02-18 ENCOUNTER — Telehealth (HOSPITAL_COMMUNITY): Payer: Self-pay | Admitting: *Deleted

## 2022-02-18 ENCOUNTER — Other Ambulatory Visit (HOSPITAL_COMMUNITY): Payer: Self-pay | Admitting: *Deleted

## 2022-02-18 DIAGNOSIS — F411 Generalized anxiety disorder: Secondary | ICD-10-CM

## 2022-02-18 MED ORDER — VILAZODONE HCL 20 MG PO TABS: 20.0000 mg | ORAL_TABLET | Freq: Every day | ORAL | 1 refills | Status: DC

## 2022-02-18 NOTE — Telephone Encounter (Signed)
Done

## 2022-02-18 NOTE — Telephone Encounter (Signed)
Pt called regarding insurance denial of brand name Viibryd. Pt states that [facility] told him that they will pay for the generic, but not the brand name as ordered. Looks like on last visit he told you they would pay but this has not been the case. Ok to send generic? Please review and advise.  ?

## 2022-02-18 NOTE — Telephone Encounter (Signed)
Yes and inform patient.Thanks. ?

## 2022-04-17 ENCOUNTER — Telehealth: Payer: Self-pay | Admitting: Nurse Practitioner

## 2022-04-17 DIAGNOSIS — J069 Acute upper respiratory infection, unspecified: Secondary | ICD-10-CM

## 2022-04-17 MED ORDER — FLUTICASONE PROPIONATE 50 MCG/ACT NA SUSP: 2.0000 | Freq: Every day | NASAL | 6 refills | Status: DC

## 2022-04-17 NOTE — Progress Notes (Signed)
E-Visit for Upper Respiratory Infection   We are sorry you are not feeling well.  Here is how we plan to help!  Based on what you have shared with me, it looks like you may have a viral upper respiratory infection.  Upper respiratory infections are caused by a large number of viruses; however, rhinovirus is the most common cause.   Symptoms vary from person to person, with common symptoms including sore throat, cough, fatigue or lack of energy and feeling of general discomfort.  A low-grade fever of up to 100.4 may present, but is often uncommon.  Symptoms vary however, and are closely related to a person's age or underlying illnesses.  The most common symptoms associated with an upper respiratory infection are nasal discharge or congestion, cough, sneezing, headache and pressure in the ears and face.  These symptoms usually persist for about 3 to 10 days, but can last up to 2 weeks.  It is important to know that upper respiratory infections do not cause serious illness or complications in most cases.    Upper respiratory infections can be transmitted from person to person, with the most common method of transmission being a person's hands.  The virus is able to live on the skin and can infect other persons for up to 2 hours after direct contact.  Also, these can be transmitted when someone coughs or sneezes; thus, it is important to cover the mouth to reduce this risk.  To keep the spread of the illness at [place], good hand hygiene is very important.  This is an infection that is most likely caused by a virus. There are no specific treatments other than to help you with the symptoms until the infection runs its course.  We are sorry you are not feeling well.  Here is how we plan to help!   For nasal congestion, you may use an oral decongestants such as Mucinex D or if you have glaucoma or high blood pressure use plain Mucinex.  Saline nasal spray or nasal drops can help and can safely be used as often as  needed for congestion.  For your congestion, I have prescribed Fluticasone nasal spray one spray in each nostril twice a day  Typically the discharge from your eyes is back up from your sinuses.   If you do not have a history of heart disease, hypertension, diabetes or thyroid disease, prostate/bladder issues or glaucoma, you may also use Sudafed to treat nasal congestion.  It is highly recommended that you consult with a pharmacist or your primary care physician to ensure this medication is safe for you to take.     If you have a cough, you may use cough suppressants I would recommend Mucinex over the counter to help loosen the mucous and to help stop you from coughing.   If you have a sore or scratchy throat, use a saltwater gargle-  to  teaspoon of salt dissolved in a 4-ounce to 8-ounce glass of warm water.  Gargle the solution for approximately 15-30 seconds and then spit.  It is important not to swallow the solution.  You can also use throat lozenges/cough drops and Chloraseptic spray to help with throat pain or discomfort.  Warm or cold liquids can also be helpful in relieving throat pain.  For headache, pain or general discomfort, you can use Ibuprofen or Tylenol as directed.   Some authorities believe that zinc sprays or the use of Echinacea may shorten the course of your symptoms.  HOME CARE Only take medications as instructed by your medical team. Be sure to drink plenty of fluids. Water is fine as well as fruit juices, sodas and electrolyte beverages. You may want to stay away from caffeine or alcohol. If you are nauseated, try taking small sips of liquids. How do you know if you are getting enough fluid? Your urine should be a pale yellow or almost colorless. Get rest. Taking a steamy shower or using a humidifier may help nasal congestion and ease sore throat pain. You can place a towel over your head and breathe in the steam from hot water coming from a faucet. Using a saline nasal  spray works much the same way. Cough drops, hard candies and sore throat lozenges may ease your cough. Avoid close contacts especially the very young and the elderly Cover your mouth if you cough or sneeze Always remember to wash your hands.   GET HELP RIGHT AWAY IF: You develop worsening fever. If your symptoms do not improve within 10 days You develop yellow or green discharge from your nose over 3 days. You have coughing fits You develop a severe head ache or visual changes. You develop shortness of breath, difficulty breathing or start having chest pain Your symptoms persist after you have completed your treatment plan  MAKE SURE YOU  Understand these instructions. Will watch your condition. Will get help right away if you are not doing well or get worse.  Thank you for choosing an e-visit.  Your e-visit answers were reviewed by a board certified advanced clinical practitioner to complete your personal care plan. Depending upon the condition, your plan could have included both over the counter or prescription medications.  Please review your pharmacy choice. Make sure the pharmacy is open so you can pick up prescription now. If there is a problem, you may contact your provider through [employer] and have the prescription routed to another pharmacy.  Your safety is important to [place]. If you have drug allergies check your prescription carefully.   For the next 24 hours you can use MyChart to ask questions about today's visit, request a non-urgent call back, or ask for a work or school excuse. You will get an email in the next two days asking about your experience. I hope that your e-visit has been valuable and will speed your recovery.  I spent approximately 5 minutes reviewing the patient's history, current symptoms and coordinating their plan of care today.    Meds ordered this encounter  Medications   fluticasone (FLONASE) 50 MCG/ACT nasal spray    Sig: Place 2 sprays into  both nostrils daily.    Dispense:  16 g    Refill:  6

## 2022-05-15 ENCOUNTER — Encounter: Payer: Self-pay | Admitting: Family

## 2022-07-27 ENCOUNTER — Telehealth (HOSPITAL_BASED_OUTPATIENT_CLINIC_OR_DEPARTMENT_OTHER): Payer: Self-pay | Admitting: Psychiatry

## 2022-07-27 ENCOUNTER — Encounter (HOSPITAL_COMMUNITY): Payer: Self-pay | Admitting: Psychiatry

## 2022-07-27 VITALS — Wt 165.0 lb

## 2022-07-27 DIAGNOSIS — F411 Generalized anxiety disorder: Secondary | ICD-10-CM

## 2022-07-27 DIAGNOSIS — F3181 Bipolar II disorder: Secondary | ICD-10-CM

## 2022-07-27 MED ORDER — CLONAZEPAM 1 MG PO TABS: ORAL_TABLET | ORAL | 2 refills | Status: DC

## 2022-07-27 MED ORDER — LAMOTRIGINE 150 MG PO TABS: 300.0000 mg | ORAL_TABLET | Freq: Every day | ORAL | 0 refills | Status: DC

## 2022-07-27 MED ORDER — HYDROXYZINE HCL 10 MG PO TABS: 10.0000 mg | ORAL_TABLET | Freq: Three times a day (TID) | ORAL | 0 refills | Status: DC | PRN

## 2022-07-27 MED ORDER — VILAZODONE HCL 20 MG PO TABS: 20.0000 mg | ORAL_TABLET | Freq: Every day | ORAL | 0 refills | Status: DC

## 2022-07-27 NOTE — Progress Notes (Signed)
Virtual Visit via Telephone Note  I connected with [place] on 07/27/22 at  3:40 PM EDT by telephone and verified that I am speaking with the correct person using two identifiers.  Location: Patient: Home Provider: Home Office   I discussed the limitations, risks, security and privacy concerns of performing an evaluation and management service by telephone and the availability of in person appointments. I also discussed with the patient that there may be a patient responsible charge related to this service. The patient expressed understanding and agreed to proceed.   History of Present Illness: Patient is evaluated by phone session.  He is concerned about his father who is slowly and gradually decompensating and the dementia is getting worse.  He is also not happy with his relationship.  Week and a half ago his girlfriend moved back to her husband.  He is not sure what is the future of the relationship and that upsets him a lot.  He admitted irritability, frustration he denies any suicidal thoughts or homicidal thoughts.  He admitted racing thoughts and some time poor sleep.  He also reported his son is busy with his relationship with a girlfriend and he hardly see him anymore.  However he is going to college.  He is taking Viibryd, Klonopin and Lamictal.  He has no rash, itching tremors or shakes.  Like to try something during the day to help his sleep because Klonopin make him sleepy.  He admitted weight loss because sometimes his appetite is not good.  He denies any mania or any psychosis.   Past Psychiatric History: Reviewed. H/O irritability, impulsive behavior and depression.  No h/o inpatient or suicidal attempt.  [facility].  Saw Dr. Toy Cookey and tried Prozac, Ambien, Cymbalta, Paxil, Wellbutrin, Lexapro, Seroquel and Klonopin.  H/O sexual side effects with most of psychotropic medication. Had GeneSight testing shows Viibryd, Zoloft,  [place], [place], [place], [place], [place] and Ambien are better choice.  Gabapentin caused dizziness.  Psychiatric Specialty Exam: Physical Exam  Review of Systems  Weight 165 lb (74.8 kg).There is no height or weight on file to calculate BMI.  General Appearance: NA  Eye Contact:  NA  Speech:  Clear and Coherent and Normal Rate  Volume:  Normal  Mood:   sometimes irritable  Affect:  NA  Thought Process:  Goal Directed  Orientation:  Full (Time, Place, and Person)  Thought Content:  WDL  Suicidal Thoughts:  No  Homicidal Thoughts:  No  Memory:  Immediate;   Good Recent;   Good Remote;   Good  Judgement:  Good  Insight:  Shallow  Psychomotor Activity:  NA  Concentration:  Concentration: Good and Attention Span: Good  Recall:  Good  Fund of Knowledge:  Good  Language:  Good  Akathisia:  No  Handed:  Right  AIMS (if indicated):     Assets:  Communication Skills Desire for Improvement Housing Talents/Skills Transportation  ADL's:  Intact  Cognition:  WNL  Sleep:   6 hrs      Assessment and Plan: Generalized anxiety disorder.  Bipolar disorder type II.  Discussed psychosocial stressors.  He is not interested in counseling.  I recommend to try low-dose hydroxyzine 10 mg to take 1 to 2 tablet as needed for anxiety.  He is willing to try.  We will continue Klonopin 1 mg at bedtime, Lamictal 300 mg daily and Viibryd 20 mg daily.  Recommend to call us back  if is any question or any concern.  Follow-up in 3 months.  He is requesting an earlier appointment which is usually 6 months.  Follow Up Instructions:    I discussed the assessment and treatment plan with the patient. The patient was provided an opportunity to ask questions and all were answered. The patient agreed with the plan and demonstrated an understanding of the instructions.   The patient was advised to call back or seek an in-person evaluation if the symptoms worsen or if the condition fails to improve as  anticipated.  Collaboration of Care: Primary Care Provider AEB notes are available in epic to review.  Patient/Guardian was advised Release of Information must be obtained prior to any record release in order to collaborate their care with an outside provider. Patient/Guardian was advised if they have not already done so to contact the registration department to sign all necessary forms in order for [place] to release information regarding their care.   Consent: Patient/Guardian gives verbal consent for treatment and assignment of benefits for services provided during this visit. Patient/Guardian expressed understanding and agreed to proceed.    I provided 21 minutes of non-face-to-face time during this encounter.   Kathlee Nations, MD

## 2022-07-31 DIAGNOSIS — M79644 Pain in right finger(s): Secondary | ICD-10-CM | POA: Diagnosis not present

## 2022-08-18 DIAGNOSIS — B078 Other viral warts: Secondary | ICD-10-CM | POA: Diagnosis not present

## 2022-08-18 DIAGNOSIS — Z23 Encounter for immunization: Secondary | ICD-10-CM | POA: Diagnosis not present

## 2022-08-18 DIAGNOSIS — L570 Actinic keratosis: Secondary | ICD-10-CM | POA: Diagnosis not present

## 2022-08-18 DIAGNOSIS — L821 Other seborrheic keratosis: Secondary | ICD-10-CM | POA: Diagnosis not present

## 2022-08-19 ENCOUNTER — Telehealth (HOSPITAL_COMMUNITY): Payer: Self-pay

## 2022-08-19 NOTE — Telephone Encounter (Signed)
Medication management - Telephone call with pharmacist at patient's [facility], after completing his prior authorization for Vilazodone 20 mg tablets, one a day, #90 online with CoverMyMeds to inform this PA was approved with a positive outcome.  Pharmacist stated it was not currently going through to be filled but would try again later today and follow up if any issues.

## 2022-09-14 DIAGNOSIS — N5201 Erectile dysfunction due to arterial insufficiency: Secondary | ICD-10-CM | POA: Diagnosis not present

## 2022-09-14 DIAGNOSIS — R351 Nocturia: Secondary | ICD-10-CM | POA: Diagnosis not present

## 2022-09-14 DIAGNOSIS — R3912 Poor urinary stream: Secondary | ICD-10-CM | POA: Diagnosis not present

## 2022-09-14 DIAGNOSIS — N401 Enlarged prostate with lower urinary tract symptoms: Secondary | ICD-10-CM | POA: Diagnosis not present

## 2022-09-17 DIAGNOSIS — B078 Other viral warts: Secondary | ICD-10-CM | POA: Diagnosis not present

## 2022-09-17 DIAGNOSIS — L570 Actinic keratosis: Secondary | ICD-10-CM | POA: Diagnosis not present

## 2022-10-26 ENCOUNTER — Encounter (HOSPITAL_COMMUNITY): Payer: Self-pay | Admitting: Psychiatry

## 2022-10-26 ENCOUNTER — Telehealth (HOSPITAL_BASED_OUTPATIENT_CLINIC_OR_DEPARTMENT_OTHER): Payer: Self-pay | Admitting: Psychiatry

## 2022-10-26 DIAGNOSIS — F411 Generalized anxiety disorder: Secondary | ICD-10-CM

## 2022-10-26 DIAGNOSIS — F3181 Bipolar II disorder: Secondary | ICD-10-CM

## 2022-10-26 MED ORDER — LAMOTRIGINE 150 MG PO TABS: 300.0000 mg | ORAL_TABLET | Freq: Every day | ORAL | 0 refills | Status: DC

## 2022-10-26 MED ORDER — VILAZODONE HCL 20 MG PO TABS: 20.0000 mg | ORAL_TABLET | Freq: Every day | ORAL | 0 refills | Status: DC

## 2022-10-26 MED ORDER — CLONAZEPAM 1 MG PO TABS: ORAL_TABLET | ORAL | 2 refills | Status: DC

## 2022-10-26 NOTE — Progress Notes (Signed)
Virtual Visit via Telephone Note  I connected with [place] on 10/26/22 at  2:40 PM EST by telephone and verified that I am speaking with the correct person using two identifiers.  Location: Patient: Home Provider: Home Office   I discussed the limitations, risks, security and privacy concerns of performing an evaluation and management service by telephone and the availability of in person appointments. I also discussed with the patient that there may be a patient responsible charge related to this service. The patient expressed understanding and agreed to proceed.   History of Present Illness: Patient is evaluated by phone session.  He admitted Christmas was very quiet because his son was with his girlfriend.  Patient admitted he resumed relationship with his girlfriend but last week he again break-up.  Patient told that he is tired in his relationship and decided to and for all.  Patient told Christmas was difficult because he was only with his daughter but after the Christmas they had a cruise with daughter and he also asked his girlfriend to go but once they return things started to get difficult again and now he decided to end the relationship.  We tried hydroxyzine because during the day he feels anxious but he did not like as he tried few times and that did not work.  His appetite is okay.  He still sleeps 5 to 6 hours.  He feels the Klonopin helping at nighttime.  He is compliant with Lamictal and Viibryd.  He has no rash, itching tremors or shakes or any EPS.  Denies any mania or any excessive spending.  He agreed to continue current medication.    Past Psychiatric History: Reviewed. H/O irritability, impulsive behavior and depression.  No h/o inpatient or suicidal attempt.  [facility].  Saw Dr. Toy Cookey and tried Prozac, Ambien, Cymbalta, Paxil, Wellbutrin, Lexapro, Seroquel and Klonopin.  H/O sexual side effects with most of  psychotropic medication. Had GeneSight testing shows Viibryd, Zoloft, [place], [place], [place], [place], [place] and Ambien are better choice.  Gabapentin caused dizziness.   Psychiatric Specialty Exam: Physical Exam  Review of Systems  Weight 165 lb (74.8 kg).There is no height or weight on file to calculate BMI.  General Appearance: NA  Eye Contact:  NA  Speech:  Clear and Coherent and Normal Rate  Volume:  Normal  Mood:  Euthymic  Affect:  NA  Thought Process:  Goal Directed  Orientation:  Full (Time, Place, and Person)  Thought Content:  Rumination  Suicidal Thoughts:  No  Homicidal Thoughts:  No  Memory:  Immediate;   Good Recent;   Good Remote;   Good  Judgement:  Good  Insight:  Shallow  Psychomotor Activity:  NA  Concentration:  Concentration: Good and Attention Span: Fair  Recall:  Good  Fund of Knowledge:  Good  Language:  Good  Akathisia:  No  Handed:  Right  AIMS (if indicated):     Assets:  Communication Skills Desire for Improvement Housing Talents/Skills Transportation  ADL's:  Intact  Cognition:  WNL  Sleep:   6 hrs      Assessment and Plan: Generalized anxiety disorder.  Bipolar disorder type II.  Discussed his psychosocial stressors.  He again break-up with the girlfriend after he feels disappointment in the relationship.  Discussed to work on his stressors.  Discontinue hydroxyzine since it did not help him.  Continue Klonopin 1 mg at bedtime, Lamictal 300 mg daily and  Viibryd 20 mg daily.  Patient not interested in therapy.  Recommend to call us back if is any question or any concern.  Follow-up in 3 months.  Follow Up Instructions:    I discussed the assessment and treatment plan with the patient. The patient was provided an opportunity to ask questions and all were answered. The patient agreed with the plan and demonstrated an understanding of the instructions.   The patient was advised to call back or seek an in-person evaluation if the  symptoms worsen or if the condition fails to improve as anticipated.  Collaboration of Care: Other provider involved in patient's care AEB notes are available in epic to review.  Patient/Guardian was advised Release of Information must be obtained prior to any record release in order to collaborate their care with an outside provider. Patient/Guardian was advised if they have not already done so to contact the registration department to sign all necessary forms in order for [place] to release information regarding their care.   Consent: Patient/Guardian gives verbal consent for treatment and assignment of benefits for services provided during this visit. Patient/Guardian expressed understanding and agreed to proceed.    I provided 19 minutes of non-face-to-face time during this encounter.   Kathlee Nations, MD

## 2022-10-27 ENCOUNTER — Ambulatory Visit: Payer: BC Managed Care – PPO | Admitting: Internal Medicine

## 2022-10-27 ENCOUNTER — Encounter: Payer: Self-pay | Admitting: Internal Medicine

## 2022-10-27 VITALS — BP 140/80 | HR 79 | Temp 98.4°F | Ht 70.0 in | Wt 174.0 lb

## 2022-10-27 DIAGNOSIS — F3170 Bipolar disorder, currently in remission, most recent episode unspecified: Secondary | ICD-10-CM | POA: Diagnosis not present

## 2022-10-27 DIAGNOSIS — R739 Hyperglycemia, unspecified: Secondary | ICD-10-CM | POA: Diagnosis not present

## 2022-10-27 DIAGNOSIS — R7989 Other specified abnormal findings of blood chemistry: Secondary | ICD-10-CM | POA: Diagnosis not present

## 2022-10-27 DIAGNOSIS — Z0000 Encounter for general adult medical examination without abnormal findings: Secondary | ICD-10-CM | POA: Diagnosis not present

## 2022-10-27 DIAGNOSIS — Z136 Encounter for screening for cardiovascular disorders: Secondary | ICD-10-CM

## 2022-10-27 DIAGNOSIS — N528 Other male erectile dysfunction: Secondary | ICD-10-CM

## 2022-10-27 DIAGNOSIS — Z0001 Encounter for general adult medical examination with abnormal findings: Secondary | ICD-10-CM | POA: Insufficient documentation

## 2022-10-27 DIAGNOSIS — Z1211 Encounter for screening for malignant neoplasm of colon: Secondary | ICD-10-CM

## 2022-10-27 LAB — COMPREHENSIVE METABOLIC PANEL
ALT: 46 U/L (ref 0–53)
AST: 25 U/L (ref 0–37)
Albumin: 4.7 g/dL (ref 3.5–5.2)
Alkaline Phosphatase: 68 U/L (ref 39–117)
BUN: 32 mg/dL — ABNORMAL HIGH (ref 6–23)
CO2: 28 mEq/L (ref 19–32)
Calcium: 10.2 mg/dL (ref 8.4–10.5)
Chloride: 102 mEq/L (ref 96–112)
Creatinine, Ser: 1.69 mg/dL — ABNORMAL HIGH (ref 0.40–1.50)
GFR: 45.11 mL/min — ABNORMAL LOW (ref >60.00)
Glucose, Bld: 92 mg/dL (ref 70–99)
Potassium: 4.9 mEq/L (ref 3.5–5.1)
Sodium: 137 mEq/L (ref 135–145)
Total Bilirubin: 0.6 mg/dL (ref 0.2–1.2)
Total Protein: 7.6 g/dL (ref 6.0–8.3)

## 2022-10-27 LAB — CBC
HCT: 48.5 % (ref 39.0–52.0)
Hemoglobin: 16.5 g/dL (ref 13.0–17.0)
MCHC: 34 g/dL (ref 30.0–36.0)
MCV: 92.1 fl (ref 78.0–100.0)
Platelets: 290 10*3/uL (ref 150.0–400.0)
RBC: 5.27 Mil/uL (ref 4.22–5.81)
RDW: 13.1 % (ref 11.5–15.5)
WBC: 9.6 10*3/uL (ref 4.0–10.5)

## 2022-10-27 LAB — LIPID PANEL
Cholesterol: 258 mg/dL — ABNORMAL HIGH (ref 0–200)
HDL: 35.3 mg/dL — ABNORMAL LOW (ref >39.00)
NonHDL: 222.72
Total CHOL/HDL Ratio: 7
Triglycerides: 211 mg/dL — ABNORMAL HIGH (ref 0.0–149.0)
VLDL: 42.2 mg/dL — ABNORMAL HIGH (ref 0.0–40.0)

## 2022-10-27 LAB — LDL CHOLESTEROL, DIRECT: Direct LDL: 180 mg/dL

## 2022-10-27 LAB — HEMOGLOBIN A1C: Hgb A1c MFr Bld: 6.2 % (ref 4.6–6.5)

## 2022-10-27 NOTE — Assessment & Plan Note (Signed)
Seeing urology and using viagra 100 mg prn. No concerns. Recent PSA with urology Dec 2023 note reviewed during visit.

## 2022-10-27 NOTE — Assessment & Plan Note (Signed)
No check in some time. Checking HgA1c and adjust as needed. Depending on results follow up 6 months.

## 2022-10-27 NOTE — Assessment & Plan Note (Addendum)
Seeing psych and overall satisfied with control on lamictal and vilazodone. He has not had routine labs in some time to assess renal and hepatic function. CBC and CMP ordered.

## 2022-10-27 NOTE — Assessment & Plan Note (Signed)
Flu shot up to date. Covid-19 counseled. Shingrix counseled declines. Tetanus declines. Cologuard due and ordered. Declines needs for STI screening. Counseled about sun safety and mole surveillance. Counseled about the dangers of distracted driving. Given 10 year screening recommendations.

## 2022-10-27 NOTE — Patient Instructions (Signed)
Turmeric and glucosamine can help slow the spread of arthritis

## 2022-10-27 NOTE — Progress Notes (Signed)
   Subjective:   Patient ID: Jermaine Kidd, male    DOB: [birth year], 57 y.o.   MRN: [id number]  HPI The patient is a 57 YO man coming in new for ongoing care and physical.  PMH, [place], social history reviewed and updated  Review of Systems  Constitutional: Negative.   HENT: Negative.    Eyes: Negative.   Respiratory:  Negative for cough, chest tightness and shortness of breath.   Cardiovascular:  Negative for chest pain, palpitations and leg swelling.  Gastrointestinal:  Negative for abdominal distention, abdominal pain, constipation, diarrhea, nausea and vomiting.  Musculoskeletal: Negative.   Skin: Negative.   Neurological: Negative.   Psychiatric/Behavioral: Negative.      Objective:  Physical Exam Constitutional:      Appearance: He is well-developed.  HENT:     Head: Normocephalic and atraumatic.  Cardiovascular:     Rate and Rhythm: Normal rate and regular rhythm.  Pulmonary:     Effort: Pulmonary effort is normal. No respiratory distress.     Breath sounds: Normal breath sounds. No wheezing or rales.  Abdominal:     General: Bowel sounds are normal. There is no distension.     Palpations: Abdomen is soft.     Tenderness: There is no abdominal tenderness. There is no rebound.  Musculoskeletal:     Cervical back: Normal range of motion.  Skin:    General: Skin is warm and dry.  Neurological:     Mental Status: He is alert and oriented to person, place, and time.     Coordination: Coordination normal.     Vitals:   10/27/22 0937 10/27/22 0943  BP: (!) 140/80 (!) 140/80  Pulse: 79   Temp: 98.4 F (36.9 C)   TempSrc: Oral   SpO2: 95%   Weight: 174 lb (78.9 kg)   Height: '5\' 10"'$  (1.778 m)     Assessment & Plan:

## 2022-11-05 DIAGNOSIS — L821 Other seborrheic keratosis: Secondary | ICD-10-CM | POA: Diagnosis not present

## 2022-11-05 DIAGNOSIS — D1801 Hemangioma of skin and subcutaneous tissue: Secondary | ICD-10-CM | POA: Diagnosis not present

## 2022-11-05 DIAGNOSIS — L578 Other skin changes due to chronic exposure to nonionizing radiation: Secondary | ICD-10-CM | POA: Diagnosis not present

## 2022-11-05 DIAGNOSIS — L570 Actinic keratosis: Secondary | ICD-10-CM | POA: Diagnosis not present

## 2022-11-05 DIAGNOSIS — L814 Other melanin hyperpigmentation: Secondary | ICD-10-CM | POA: Diagnosis not present

## 2022-11-05 DIAGNOSIS — D485 Neoplasm of uncertain behavior of skin: Secondary | ICD-10-CM | POA: Diagnosis not present

## 2022-11-08 DIAGNOSIS — Z1211 Encounter for screening for malignant neoplasm of colon: Secondary | ICD-10-CM | POA: Diagnosis not present

## 2022-11-19 LAB — COLOGUARD: COLOGUARD: NEGATIVE

## 2022-11-20 ENCOUNTER — Encounter: Payer: Self-pay | Admitting: Internal Medicine

## 2022-11-20 LAB — HM COLONOSCOPY

## 2022-12-01 ENCOUNTER — Encounter: Payer: Self-pay | Admitting: Internal Medicine

## 2022-12-01 ENCOUNTER — Ambulatory Visit: Payer: BC Managed Care – PPO | Admitting: Internal Medicine

## 2022-12-01 VITALS — BP 126/100 | HR 71 | Temp 97.8°F | Ht 70.0 in | Wt 175.0 lb

## 2022-12-01 DIAGNOSIS — E785 Hyperlipidemia, unspecified: Secondary | ICD-10-CM | POA: Insufficient documentation

## 2022-12-01 DIAGNOSIS — R7989 Other specified abnormal findings of blood chemistry: Secondary | ICD-10-CM | POA: Diagnosis not present

## 2022-12-01 DIAGNOSIS — R7303 Prediabetes: Secondary | ICD-10-CM | POA: Diagnosis not present

## 2022-12-01 DIAGNOSIS — E782 Mixed hyperlipidemia: Secondary | ICD-10-CM

## 2022-12-01 LAB — COMPREHENSIVE METABOLIC PANEL
ALT: 33 U/L (ref 0–53)
AST: 21 U/L (ref 0–37)
Albumin: 4.5 g/dL (ref 3.5–5.2)
Alkaline Phosphatase: 57 U/L (ref 39–117)
BUN: 32 mg/dL — ABNORMAL HIGH (ref 6–23)
CO2: 26 mEq/L (ref 19–32)
Calcium: 9.9 mg/dL (ref 8.4–10.5)
Chloride: 102 mEq/L (ref 96–112)
Creatinine, Ser: 1.59 mg/dL — ABNORMAL HIGH (ref 0.40–1.50)
GFR: 48.51 mL/min — ABNORMAL LOW (ref >60.00)
Glucose, Bld: 89 mg/dL (ref 70–99)
Potassium: 3.9 mEq/L (ref 3.5–5.1)
Sodium: 136 mEq/L (ref 135–145)
Total Bilirubin: 0.4 mg/dL (ref 0.2–1.2)
Total Protein: 7.5 g/dL (ref 6.0–8.3)

## 2022-12-01 NOTE — Progress Notes (Signed)
   Subjective:   Patient ID: Jermaine Kidd, male    DOB: April 28, 1966, 57 y.o.   MRN: [id number]  HPI The patient is a 57 YO man coming in for discussion about labs.  Review of Systems  Constitutional: Negative.   HENT: Negative.    Eyes: Negative.   Respiratory:  Negative for cough, chest tightness and shortness of breath.   Cardiovascular:  Negative for chest pain, palpitations and leg swelling.  Gastrointestinal:  Negative for abdominal distention, abdominal pain, constipation, diarrhea, nausea and vomiting.  Musculoskeletal: Negative.   Skin: Negative.   Neurological: Negative.   Psychiatric/Behavioral: Negative.      Objective:  Physical Exam Constitutional:      Appearance: He is well-developed.  HENT:     Head: Normocephalic and atraumatic.  Cardiovascular:     Rate and Rhythm: Normal rate and regular rhythm.  Pulmonary:     Effort: Pulmonary effort is normal. No respiratory distress.     Breath sounds: Normal breath sounds. No wheezing or rales.  Abdominal:     General: Bowel sounds are normal. There is no distension.     Palpations: Abdomen is soft.     Tenderness: There is no abdominal tenderness. There is no rebound.  Musculoskeletal:     Cervical back: Normal range of motion.  Skin:    General: Skin is warm and dry.  Neurological:     Mental Status: He is alert and oriented to person, place, and time.     Coordination: Coordination normal.     Vitals:   12/01/22 1317 12/01/22 1324  BP: (!) 126/100 (!) 126/100  Pulse: 71   Temp: 97.8 F (36.6 C)   TempSrc: Oral   SpO2: 99%   Weight: 175 lb (79.4 kg)   Height: 5' 10"$  (1.778 m)     Assessment & Plan:  Visit time 20 minutes in face to face communication with patient and coordination of care, additional 5 minutes spent in record review, coordination or care, ordering tests, communicating/referring to other healthcare professionals, documenting in medical records all on the same day of the visit for total  time 25 minutes spent on the visit.

## 2022-12-01 NOTE — Assessment & Plan Note (Signed)
Compared to prior creatinine in 2018 significant elevation but without interval labs unclear timeline of change. He is taking creatinine to help with muscle building and will recheck labs today with CMP. If persistently high will ask him to reduce this and do recheck. BP is persistently high today and may need BP treatment in near future.

## 2022-12-01 NOTE — Assessment & Plan Note (Signed)
Counseled about recent HgA1c 6.2 and likely related to medications which have known glucose impairment. Will need monitoring every 3-6 months with HgA1c and discussed dietary changes today with him.

## 2022-12-01 NOTE — Assessment & Plan Note (Signed)
Discussed lipid panel and he defers medication for now. Would like to make dietary changes and recheck in 3-6 months which is reasonable. We discussed that his mental health medications put him at risk for metabolic complications including lipid changes.

## 2023-01-25 ENCOUNTER — Encounter (HOSPITAL_COMMUNITY): Payer: Self-pay | Admitting: Psychiatry

## 2023-01-25 ENCOUNTER — Telehealth (HOSPITAL_BASED_OUTPATIENT_CLINIC_OR_DEPARTMENT_OTHER): Payer: BC Managed Care – PPO | Admitting: Psychiatry

## 2023-01-25 DIAGNOSIS — F411 Generalized anxiety disorder: Secondary | ICD-10-CM

## 2023-01-25 DIAGNOSIS — F3181 Bipolar II disorder: Secondary | ICD-10-CM

## 2023-01-25 MED ORDER — CLONAZEPAM 1 MG PO TABS: ORAL_TABLET | ORAL | 2 refills | Status: DC

## 2023-01-25 MED ORDER — LAMOTRIGINE 150 MG PO TABS: 300.0000 mg | ORAL_TABLET | Freq: Every day | ORAL | 0 refills | Status: DC

## 2023-01-25 MED ORDER — VILAZODONE HCL 20 MG PO TABS: 20.0000 mg | ORAL_TABLET | Freq: Every day | ORAL | 0 refills | Status: DC

## 2023-01-25 NOTE — Progress Notes (Signed)
[facility] MD Virtual Progress Note   Patient Location: Home Provider Location: Home Office  I connect with patient by telephone and verified that I am speaking with correct person by using two identifiers. I discussed the limitations of evaluation and management by telemedicine and the availability of in person appointments. I also discussed with the patient that there may be a patient responsible charge related to this service. The patient expressed understanding and agreed to proceed.  Jermaine Kidd [id number] 57 y.o.  01/25/2023 2:39 PM  History of Present Illness:  Patient is evaluated by phone session.  He admitted past few weeks were very busy.  His 58 year old father died at home who had moderate dementia.  Patient told he is now cleaning the house and donating stuff.  Patient told he had accumulated a lot of stuff and slowly and gradually he is trying to get clean.  His plan is to sell the house.  Patient told past 3 months was very difficult for his father as hospice was involved.  Patient also decided to end the relationship and that has given a lot of relief and relaxation.  He has no longer severe panic attack.  His daughter is graduating from [employer] but going to to the master from online and moving back to his house.  Patient taking his medication as prescribed.  He sleeps okay.  He reported symptoms are much better and is stable.  He feels less stress since he and the relationship.  His son is doing well.  Denies any tremors or shakes or any EPS.  He denies any crying spells or any feeling of hopelessness or worthlessness.  His appetite is okay.  His weight is stable.  He now established care with the new primary care physician and he is happy about it.  He denies any mania, psychosis, impulsive behavior.  He is compliant with medication and denied any rash or any itching.  Past Psychiatric History: H/O irritability, impulsive behavior and depression.  No h/o  inpatient or suicidal attempt.  [facility].  Saw Dr. Toni Arthurs and tried Prozac, Ambien, Cymbalta, Paxil, Wellbutrin, Lexapro, Seroquel and Klonopin.  H/O sexual side effects with most of psychotropic medication. Had [address] testing shows Viibryd, Zoloft, [place], [place], [place], [place], [place] and Ambien are better choice.  Gabapentin caused dizziness.    Outpatient Encounter Medications as of 01/25/2023  Medication Sig   clonazePAM (KLONOPIN) 1 MG tablet Take one tab at bed time.   fluorouracil (EFUDEX) 5 % cream 1 application Externally Three times a week for 30 days   hydrOXYzine (ATARAX) 10 MG tablet Take 1-2 tablets (10-20 mg total) by mouth 3 (three) times daily as needed.   lamoTRIgine (LAMICTAL) 150 MG tablet Take 2 tablets (300 mg total) by mouth daily.   sildenafil (VIAGRA) 100 MG tablet SMARTSIG:1 Tablet(s) By Mouth As Needed   Vilazodone HCl 20 MG TABS Take 1 tablet (20 mg total) by mouth daily.   No facility-administered encounter medications on file as of 01/25/2023.    Recent Results (from the past 2160 hour(s))  Cologuard     Status: None   Collection Time: 11/08/22  7:30 PM  Result Value Ref Range   COLOGUARD Negative Negative    Comment:  NEGATIVE TEST RESULT. A negative Cologuard result indicates a low likelihood that a colorectal cancer (CRC) or advanced adenoma (adenomatous polyps with more advanced pre-malignant features)  is present. The chance that  a person with a negative Cologuard test has a colorectal cancer is less than 1 in 1500 (negative predictive value >99.9%) or has an  advanced adenoma is less than  5.3% (negative predictive value 94.7%). These data are based on a prospective cross-sectional study of 10,000 individuals at average risk for colorectal cancer who were screened with both Cologuard and colonoscopy. (Imperiale T. et al, N Engl J Med 2014;370(14):1286-1297) The normal value (reference range) for  this assay is negative.  COLOGUARD RE-SCREENING RECOMMENDATION: Periodic colorectal cancer screening is an important part of preventive healthcare for asymptomatic individuals at average risk for colorectal cancer.  Following a negative Cologuard result, the American Cancer Society and U.S.  Multi-Society Task Force screening guidelines recommend a Cologuard re-screening interval of 3 years.  References: American Cancer Society Guideline for Colorectal Cancer Screening: https://www.cancer.org/cancer/colon-rectal-cancer/detection-diagnosis-staging/acs-recommendations.html.; Rex DK, Boland CR, Dominitz JK, Colorectal Cancer Screening: Recommendations for Physicians and Patients from the U.S. Multi-Society Task Force on Colorectal Cancer Screening , Am J Gastroenterology 2017; 112:1016-1030.  TEST DESCRIPTION: Composite algorithmic analysis of stool DNA-biomarkers with hemoglobin immunoassay.   Quantitative values of individual biomarkers are not reportable and are not associated with individual biomarker result reference ranges. Cologuard is intended for colorectal cancer screening of adults of either sex, 45 years or older, who are at average-risk for colorectal cancer (CRC). Cologuard has been approved for use by the U.S. FDA. The performance of Cologuard was  established in a cross sectional study of average-risk adults aged 21-84. Cologuard performance in patients ages 48 to 3 years was estimated by sub-group analysis of near-age groups. Colonoscopies performed for a positive result may find as the most clinically significant lesion: colorectal cancer [4.0%], advanced adenoma (including sessile serrated polyps greater than or equal to 1cm diameter) [20%] or non- advanced adenoma [31%]; or no colorectal neoplasia [45%]. These estimates are derived from a prospective cross-sectional screening study of 10,000 individuals at average risk for colorectal cancer who were screened with both Cologuard and  colonoscopy. (Imperiale T. et al, Macy Mis J Med 2014;370(14):1286-1297.) Cologuard may produce a false negative or false positive result (no colorectal cancer or precancerous polyp present at colonoscopy follow up). A negative Cologuard test result does not guarantee the absence of CRC or advanced adenoma (pre-cancer). The current Cologuard  screening interval is every 3 years. Science writer and U.S. Therapist, music). Cologuard performance data in a 10,000 patient pivotal study using colonoscopy as the reference method can be accessed at the following location: www.exactlabs.com/results. Additional description of the Cologuard test process, warnings and precautions can be found at www.cologuard.com.   HM COLONOSCOPY     Status: None   Collection Time: 11/20/22 12:00 AM  Result Value Ref Range   HM Colonoscopy See Report (in chart) See Report (in chart), Patient Reported    Comment: Negative  Comp Met (CMET)     Status: Abnormal   Collection Time: 12/01/22  2:05 PM  Result Value Ref Range   Sodium 136 135 - 145 mEq/L   Potassium 3.9 3.5 - 5.1 mEq/L   Chloride 102 96 - 112 mEq/L   CO2 26 19 - 32 mEq/L   Glucose, Bld 89 70 - 99 mg/dL   BUN 32 (H) 6 - 23 mg/dL   Creatinine, Ser 1.61 (H) 0.40 - 1.50 mg/dL   Total Bilirubin 0.4 0.2 - 1.2 mg/dL   Alkaline Phosphatase 57 39 - 117 U/L   AST 21 0 - 37 U/L   ALT 33 0 -  53 U/L   Total Protein 7.5 6.0 - 8.3 g/dL   Albumin 4.5 3.5 - 5.2 g/dL   GFR 96.04 (L) >54.09 mL/min    Comment: Calculated using the CKD-EPI Creatinine Equation (2021)   Calcium 9.9 8.4 - 10.5 mg/dL     Psychiatric Specialty Exam: Physical Exam  Review of Systems  Weight 175 lb (79.4 kg).There is no height or weight on file to calculate BMI.  General Appearance: NA  Eye Contact:  NA  Speech:  Clear and Coherent and Normal Rate  Volume:  Normal  Mood:  Euthymic  Affect:  NA  Thought Process:  Goal Directed  Orientation:  Full (Time, Place, and Person)   Thought Content:  Logical  Suicidal Thoughts:  No  Homicidal Thoughts:  No  Memory:  Immediate;   Good Recent;   Good Remote;   Good  Judgement:  Intact  Insight:  Good  Psychomotor Activity:  Normal  Concentration:  Concentration: Good and Attention Span: Good  Recall:  Good  Fund of Knowledge:  Good  Language:  Good  Akathisia:  No  Handed:  Right  AIMS (if indicated):     Assets:  Communication Skills Desire for Improvement Housing Resilience Social Support Transportation  ADL's:  Intact  Cognition:  WNL  Sleep:  ok     Assessment/Plan: Generalized anxiety disorder - Plan: clonazePAM (KLONOPIN) 1 MG tablet, Vilazodone HCl 20 MG TABS  Bipolar II disorder, moderate, depressed, with anxious distress - Plan: lamoTRIgine (LAMICTAL) 150 MG tablet, Vilazodone HCl 20 MG TABS  Patient is stable on his current medication.  Discussed recent loss of 85 year old father and discussed ending the relationship with a girlfriend which actually helped him a lot and he is not as anxious.  He wants to keep the current medication.  I offered therapy but if he does not need it.  Continue Klonopin 1 mg at bedtime, Lamictal 300 mg daily and Viibryd 20 mg daily.  Recommended to call us back if is any question or any concern.  Follow-up in 3 months.   Follow Up Instructions:     I discussed the assessment and treatment plan with the patient. The patient was provided an opportunity to ask questions and all were answered. The patient agreed with the plan and demonstrated an understanding of the instructions.   The patient was advised to call back or seek an in-person evaluation if the symptoms worsen or if the condition fails to improve as anticipated.    Collaboration of Care: Other provider involved in patient's care AEB notes are available in epic to review.  Patient/Guardian was advised Release of Information must be obtained prior to any record release in order to collaborate their care  with an outside provider. Patient/Guardian was advised if they have not already done so to contact the registration department to sign all necessary forms in order for [place] to release information regarding their care.   Consent: Patient/Guardian gives verbal consent for treatment and assignment of benefits for services provided during this visit. Patient/Guardian expressed understanding and agreed to proceed.     I provided 18 minutes of non face to face time during this encounter.  Note: This document was prepared by [employer] voice dictation technology and any errors that results from this process are unintentional.    Cleotis Nipper, MD 01/25/2023

## 2023-02-17 ENCOUNTER — Ambulatory Visit: Payer: BC Managed Care – PPO | Admitting: Internal Medicine

## 2023-02-17 ENCOUNTER — Encounter: Payer: Self-pay | Admitting: Internal Medicine

## 2023-02-17 VITALS — BP 126/82 | HR 68 | Temp 97.9°F | Ht 70.0 in

## 2023-02-17 DIAGNOSIS — Z113 Encounter for screening for infections with a predominantly sexual mode of transmission: Secondary | ICD-10-CM

## 2023-02-17 DIAGNOSIS — R7989 Other specified abnormal findings of blood chemistry: Secondary | ICD-10-CM | POA: Diagnosis not present

## 2023-02-17 LAB — COMPREHENSIVE METABOLIC PANEL
ALT: 54 U/L — ABNORMAL HIGH (ref 0–53)
AST: 33 U/L (ref 0–37)
Albumin: 4.5 g/dL (ref 3.5–5.2)
Alkaline Phosphatase: 60 U/L (ref 39–117)
BUN: 36 mg/dL — ABNORMAL HIGH (ref 6–23)
CO2: 25 mEq/L (ref 19–32)
Calcium: 10.2 mg/dL (ref 8.4–10.5)
Chloride: 104 mEq/L (ref 96–112)
Creatinine, Ser: 1.33 mg/dL (ref 0.40–1.50)
GFR: 60.01 mL/min (ref >60.00)
Glucose, Bld: 108 mg/dL — ABNORMAL HIGH (ref 70–99)
Potassium: 4.3 mEq/L (ref 3.5–5.1)
Sodium: 139 mEq/L (ref 135–145)
Total Bilirubin: 0.4 mg/dL (ref 0.2–1.2)
Total Protein: 7.3 g/dL (ref 6.0–8.3)

## 2023-02-17 NOTE — Progress Notes (Signed)
   Subjective:   Patient ID: Jermaine Kidd, male    DOB: [birth year], 57 y.o.   MRN: [id number]  HPI The patient is a 57 YO man coming in for follow up. Needs STI screening new partner.   Review of Systems  Constitutional: Negative.   HENT: Negative.    Eyes: Negative.   Respiratory:  Negative for cough, chest tightness and shortness of breath.   Cardiovascular:  Negative for chest pain, palpitations and leg swelling.  Gastrointestinal:  Negative for abdominal distention, abdominal pain, constipation, diarrhea, nausea and vomiting.  Musculoskeletal: Negative.   Skin: Negative.   Neurological: Negative.   Psychiatric/Behavioral: Negative.      Objective:  Physical Exam Constitutional:      Appearance: He is well-developed.  HENT:     Head: Normocephalic and atraumatic.  Cardiovascular:     Rate and Rhythm: Normal rate and regular rhythm.  Pulmonary:     Effort: Pulmonary effort is normal. No respiratory distress.     Breath sounds: Normal breath sounds. No wheezing or rales.  Abdominal:     General: Bowel sounds are normal. There is no distension.     Palpations: Abdomen is soft.     Tenderness: There is no abdominal tenderness. There is no rebound.  Musculoskeletal:     Cervical back: Normal range of motion.  Skin:    General: Skin is warm and dry.  Neurological:     Mental Status: He is alert and oriented to person, place, and time.     Coordination: Coordination normal.     Vitals:   02/17/23 1055  BP: 126/82  Pulse: 68  Temp: 97.9 F (36.6 C)  TempSrc: Oral  SpO2: 97%  Height: 5\' 10"  (1.778 m)    Assessment & Plan:

## 2023-02-17 NOTE — Patient Instructions (Signed)
We will check the labs today and the STD screening.

## 2023-02-18 LAB — HEPATITIS C ANTIBODY: Hepatitis C Ab: NONREACTIVE

## 2023-02-18 LAB — HIV ANTIBODY (ROUTINE TESTING W REFLEX): HIV 1&2 Ab, 4th Generation: NONREACTIVE

## 2023-02-18 LAB — RPR: RPR Ser Ql: NONREACTIVE

## 2023-02-19 DIAGNOSIS — Z113 Encounter for screening for infections with a predominantly sexual mode of transmission: Secondary | ICD-10-CM | POA: Insufficient documentation

## 2023-02-19 NOTE — Assessment & Plan Note (Signed)
New partner and wants to get testing which is done hep c, hiv, RPR, GC/chlamydia. Treat as appropriate.

## 2023-02-19 NOTE — Assessment & Plan Note (Signed)
Did not stop creatinine supplements as discussed last time. Checking CMP. BP at goal.

## 2023-02-20 LAB — GC/CHLAMYDIA PROBE AMP
Chlamydia trachomatis, NAA: NEGATIVE
Neisseria Gonorrhoeae by PCR: NEGATIVE

## 2023-04-06 DIAGNOSIS — B078 Other viral warts: Secondary | ICD-10-CM | POA: Diagnosis not present

## 2023-04-06 DIAGNOSIS — L820 Inflamed seborrheic keratosis: Secondary | ICD-10-CM | POA: Diagnosis not present

## 2023-04-06 DIAGNOSIS — L570 Actinic keratosis: Secondary | ICD-10-CM | POA: Diagnosis not present

## 2023-04-24 ENCOUNTER — Other Ambulatory Visit (HOSPITAL_COMMUNITY): Payer: Self-pay | Admitting: Psychiatry

## 2023-04-24 DIAGNOSIS — F3181 Bipolar II disorder: Secondary | ICD-10-CM

## 2023-04-24 DIAGNOSIS — F411 Generalized anxiety disorder: Secondary | ICD-10-CM

## 2023-04-26 ENCOUNTER — Telehealth (HOSPITAL_BASED_OUTPATIENT_CLINIC_OR_DEPARTMENT_OTHER): Payer: BC Managed Care – PPO | Admitting: Psychiatry

## 2023-04-26 ENCOUNTER — Encounter (HOSPITAL_COMMUNITY): Payer: Self-pay | Admitting: Psychiatry

## 2023-04-26 VITALS — Wt 175.0 lb

## 2023-04-26 DIAGNOSIS — F99 Mental disorder, not otherwise specified: Secondary | ICD-10-CM | POA: Diagnosis not present

## 2023-04-26 DIAGNOSIS — F3181 Bipolar II disorder: Secondary | ICD-10-CM | POA: Diagnosis not present

## 2023-04-26 DIAGNOSIS — F411 Generalized anxiety disorder: Secondary | ICD-10-CM | POA: Diagnosis not present

## 2023-04-26 DIAGNOSIS — F5105 Insomnia due to other mental disorder: Secondary | ICD-10-CM | POA: Diagnosis not present

## 2023-04-26 MED ORDER — LAMOTRIGINE 150 MG PO TABS
300.0000 mg | ORAL_TABLET | Freq: Every day | ORAL | 0 refills | Status: DC
Start: 2023-04-26 — End: 2023-07-26

## 2023-04-26 MED ORDER — CLONAZEPAM 1 MG PO TABS
ORAL_TABLET | ORAL | 2 refills | Status: DC
Start: 2023-04-26 — End: 2023-07-26

## 2023-04-26 MED ORDER — VILAZODONE HCL 20 MG PO TABS
20.0000 mg | ORAL_TABLET | Freq: Every day | ORAL | 0 refills | Status: DC
Start: 2023-04-26 — End: 2023-07-26

## 2023-04-26 MED ORDER — HYDROXYZINE HCL 10 MG PO TABS
10.0000 mg | ORAL_TABLET | Freq: Three times a day (TID) | ORAL | 0 refills | Status: DC | PRN
Start: 2023-04-26 — End: 2023-10-26

## 2023-04-26 NOTE — Progress Notes (Signed)
[facility] MD Virtual Progress Note   Patient Location: Home Provider Location: Home Office  I connect with patient by telephone and verified that I am speaking with correct person by using two identifiers. I discussed the limitations of evaluation and management by telemedicine and the availability of in person appointments. I also discussed with the patient that there may be a patient responsible charge related to this service. The patient expressed understanding and agreed to proceed.  Jermaine Kidd [id number] 57 y.o.  04/26/2023 2:08 PM  History of Present Illness:  Patient is evaluated by phone session.  He reported things are okay but he has noticed lately not able to sleep very well.  He is having racing thoughts but denies any mania, psychosis, hallucination.  His appetite is okay.  His daughter is going to college doing masters.  He do not reported any stress that may cause insomnia but reported chronic anxiety.  Currently he is not in any relationship.  Recently had a blood work 2 months ago and his creatinine slightly increased.  He admitted tried to drink water when he remembers.  Denies any panic attack, crying spells or any feeling of hopelessness or worthlessness.  His appetite is okay.  He has no tremors, shakes or any EPS.  He had no establish care with a primary care.  He reported his father's property is on the market and is hoping to sell it very soon.  He reported since his father died he struggled with sleep.  He used to take hydroxyzine but has not taken recently.  He is not sure if it helped but willing to try again.  He is compliant with Klonopin, Viibryd, Lamictal and denies any rash, itching, shakes.  Denies any impulsive behavior.  Past Psychiatric History: H/O irritability, impulsive behavior and depression.  No h/o inpatient or suicidal attempt.  [facility]  Saw Dr. Toni Arthurs and tried Prozac, Ambien,  Cymbalta, Paxil, Wellbutrin, Lexapro, Seroquel and Klonopin.  H/O sexual side effects with most of psychotropic medication. Had [address] testing shows Viibryd, Zoloft, [place], [place], [place], [place], [place] and Ambien are better choice.  Gabapentin caused dizziness.    Outpatient Encounter Medications as of 04/26/2023  Medication Sig   clonazePAM (KLONOPIN) 1 MG tablet Take one tab at bed time.   fluorouracil (EFUDEX) 5 % cream 1 application Externally Three times a week for 30 days   hydrOXYzine (ATARAX) 10 MG tablet Take 1-2 tablets (10-20 mg total) by mouth 3 (three) times daily as needed.   lamoTRIgine (LAMICTAL) 150 MG tablet Take 2 tablets (300 mg total) by mouth daily.   sildenafil (VIAGRA) 100 MG tablet SMARTSIG:1 Tablet(s) By Mouth As Needed   Vilazodone HCl 20 MG TABS Take 1 tablet (20 mg total) by mouth daily.   No facility-administered encounter medications on file as of 04/26/2023.    Recent Results (from the past 2160 hour(s))  HIV Antibody (routine testing w rflx)     Status: None   Collection Time: 02/17/23 11:57 AM  Result Value Ref Range   HIV 1&2 Ab, 4th Generation NON-REACTIVE NON-REACTIVE    Comment: HIV-1 antigen and HIV-1/HIV-2 antibodies were not detected. There is no laboratory evidence of HIV infection. Marland Kitchen PLEASE NOTE: This information has been disclosed to you from records whose confidentiality may be protected by state law.  If your state requires such protection, then the state law prohibits you from making any further disclosure of the  information without the specific written consent of the person to whom it pertains, or as otherwise permitted by law. A general authorization for the release of medical or other information is NOT sufficient for this purpose. . For additional information please refer to http://education.questdiagnostics.com/faq/FAQ106 (This link is being provided for informational/ educational purposes only.) . Marland Kitchen The performance  of this assay has not been clinically validated in patients less than 25 years old. .   Hepatitis C antibody     Status: None   Collection Time: 02/17/23 11:57 AM  Result Value Ref Range   Hepatitis C Ab NON-REACTIVE NON-REACTIVE    Comment: . HCV antibody was non-reactive. There is no laboratory  evidence of HCV infection. . In most cases, no further action is required. However, if recent HCV exposure is suspected, a test for HCV RNA (test code 40981) is suggested. . For additional information please refer to http://education.quest[facility].com/faq/FAQ22v1 (This link is being provided for informational/ educational purposes only.) .   GC/Chlamydia Probe Amp     Status: None   Collection Time: 02/17/23 11:57 AM   Specimen: Blood   BLD  Result Value Ref Range   Chlamydia trachomatis, NAA Negative Negative   Neisseria Gonorrhoeae by PCR Negative Negative  RPR     Status: None   Collection Time: 02/17/23 11:57 AM  Result Value Ref Range   RPR Ser Ql NON-REACTIVE NON-REACTIVE    Comment: . No laboratory evidence of syphilis. If recent exposure is suspected, submit a new sample in 2-4 weeks. .   Comprehensive metabolic panel     Status: Abnormal   Collection Time: 02/17/23 11:57 AM  Result Value Ref Range   Sodium 139 135 - 145 mEq/L   Potassium 4.3 3.5 - 5.1 mEq/L   Chloride 104 96 - 112 mEq/L   CO2 25 19 - 32 mEq/L   Glucose, Bld 108 (H) 70 - 99 mg/dL   BUN 36 (H) 6 - 23 mg/dL   Creatinine, Ser 1.91 0.40 - 1.50 mg/dL   Total Bilirubin 0.4 0.2 - 1.2 mg/dL   Alkaline Phosphatase 60 39 - 117 U/L   AST 33 0 - 37 U/L   ALT 54 (H) 0 - 53 U/L   Total Protein 7.3 6.0 - 8.3 g/dL   Albumin 4.5 3.5 - 5.2 g/dL   GFR 47.82 >95.62 mL/min    Comment: Calculated using the CKD-EPI Creatinine Equation (2021)   Calcium 10.2 8.4 - 10.5 mg/dL     Psychiatric Specialty Exam: Physical Exam  Review of Systems  Weight 175 lb (79.4 kg).There is no height or weight on file to  calculate BMI.  General Appearance: NA  Eye Contact:  NA  Speech:  Normal Rate  Volume:  Normal  Mood:  Euthymic  Affect:  Appropriate  Thought Process:  Goal Directed  Orientation:  Full (Time, Place, and Person)  Thought Content:  WDL  Suicidal Thoughts:  No  Homicidal Thoughts:  No  Memory:  Immediate;   Good Recent;   Good Remote;   Good  Judgement:  Intact  Insight:  Present  Psychomotor Activity:  NA  Concentration:  Concentration: Good and Attention Span: Good  Recall:  Good  Fund of Knowledge:  Good  Language:  Good  Akathisia:  No  Handed:  Right  AIMS (if indicated):     Assets:  Communication Skills Desire for Improvement Housing Resilience Social Support Talents/Skills Transportation  ADL's:  Intact  Cognition:  WNL  Sleep:  poor  Assessment/Plan: Bipolar II disorder, moderate, depressed, with anxious distress (HCC) - Plan: hydrOXYzine (ATARAX) 10 MG tablet, lamoTRIgine (LAMICTAL) 150 MG tablet, Vilazodone HCl 20 MG TABS  Generalized anxiety disorder - Plan: hydrOXYzine (ATARAX) 10 MG tablet, clonazePAM (KLONOPIN) 1 MG tablet, Vilazodone HCl 20 MG TABS  Insomnia due to other mental disorder, we will try hydroxyzine  Discussed insomnia.  Patient recall he had a sleep study but did not diagnosed with any apnea or other condition.  He recalled taking hydroxyzine but not sure why he stopped.  I offered to go back and try again and he is willing to agree to take it.  I also reviewed blood work results.  Creatinine slightly increased from the past.  He is not interested in therapy.  So far he is tolerating all his medication and reported no side effects.  I will continue Lamictal 150 mg 2 tablet daily, Viibryd 20 mg daily, Klonopin 1 mg at bedtime and restart hydroxyzine 10 mg 1 to 2 tablet as needed for sleep.  Encourage exercise and watching his calorie intake.  Recommend to call us back if he has any question or any concern.  Follow-up in 3 months.   Follow  Up Instructions:     I discussed the assessment and treatment plan with the patient. The patient was provided an opportunity to ask questions and all were answered. The patient agreed with the plan and demonstrated an understanding of the instructions.   The patient was advised to call back or seek an in-person evaluation if the symptoms worsen or if the condition fails to improve as anticipated.    Collaboration of Care: Other provider involved in patient's care AEB notes are available in epic to review.  Patient/Guardian was advised Release of Information must be obtained prior to any record release in order to collaborate their care with an outside provider. Patient/Guardian was advised if they have not already done so to contact the registration department to sign all necessary forms in order for [place] to release information regarding their care.   Consent: Patient/Guardian gives verbal consent for treatment and assignment of benefits for services provided during this visit. Patient/Guardian expressed understanding and agreed to proceed.      Note: This document was prepared by [employer] voice dictation technology and any errors that results from this process are unintentional.    Cleotis Nipper, MD 04/26/2023

## 2023-04-27 ENCOUNTER — Ambulatory Visit: Payer: BC Managed Care – PPO | Admitting: Internal Medicine

## 2023-05-04 ENCOUNTER — Encounter: Payer: Self-pay | Admitting: Internal Medicine

## 2023-05-04 ENCOUNTER — Ambulatory Visit: Payer: BC Managed Care – PPO | Admitting: Internal Medicine

## 2023-05-04 VITALS — BP 140/80 | HR 83 | Temp 98.0°F | Ht 70.0 in | Wt 175.0 lb

## 2023-05-04 DIAGNOSIS — R7303 Prediabetes: Secondary | ICD-10-CM | POA: Diagnosis not present

## 2023-05-04 LAB — POCT GLYCOSYLATED HEMOGLOBIN (HGB A1C): HbA1c POC (<> result, manual entry): 6.1 % (ref 4.0–5.6)

## 2023-05-04 NOTE — Assessment & Plan Note (Signed)
POC HgA1c done today and 6.1%. Counseled on strategies to reduce risk for progression. Medication treatment makes him more likely to have glucose impairment.

## 2023-05-04 NOTE — Progress Notes (Signed)
   Subjective:   Patient ID: Jermaine Kidd, male    DOB: November 12, 1965, 57 y.o.   MRN: [id number]  HPI The patient is a 57 YO man coming in for sugar follow up.   Review of Systems  Constitutional: Negative.   HENT: Negative.    Eyes: Negative.   Respiratory:  Negative for cough, chest tightness and shortness of breath.   Cardiovascular:  Negative for chest pain, palpitations and leg swelling.  Gastrointestinal:  Negative for abdominal distention, abdominal pain, constipation, diarrhea, nausea and vomiting.  Musculoskeletal: Negative.   Skin: Negative.   Neurological: Negative.   Psychiatric/Behavioral: Negative.      Objective:  Physical Exam Constitutional:      Appearance: Normal appearance.  HENT:     Head: Normocephalic and atraumatic.  Pulmonary:     Effort: Pulmonary effort is normal.  Musculoskeletal:     Cervical back: Normal range of motion.  Neurological:     General: No focal deficit present.     Mental Status: He is alert and oriented to person, place, and time.     Vitals:   05/04/23 1338 05/04/23 1341  BP: (!) 140/80 (!) 140/80  Pulse: 83   Temp: 98 F (36.7 C)   TempSrc: Oral   SpO2: 97%   Weight: 175 lb (79.4 kg)   Height: 5\' 10"  (1.778 m)     Assessment & Plan:

## 2023-06-01 DIAGNOSIS — B078 Other viral warts: Secondary | ICD-10-CM | POA: Diagnosis not present

## 2023-06-01 DIAGNOSIS — B079 Viral wart, unspecified: Secondary | ICD-10-CM | POA: Diagnosis not present

## 2023-06-01 DIAGNOSIS — D2271 Melanocytic nevi of right lower limb, including hip: Secondary | ICD-10-CM | POA: Diagnosis not present

## 2023-06-01 DIAGNOSIS — D485 Neoplasm of uncertain behavior of skin: Secondary | ICD-10-CM | POA: Diagnosis not present

## 2023-06-01 DIAGNOSIS — L820 Inflamed seborrheic keratosis: Secondary | ICD-10-CM | POA: Diagnosis not present

## 2023-06-24 DIAGNOSIS — S46811A Strain of other muscles, fascia and tendons at shoulder and upper arm level, right arm, initial encounter: Secondary | ICD-10-CM | POA: Diagnosis not present

## 2023-06-24 DIAGNOSIS — S2341XA Sprain of ribs, initial encounter: Secondary | ICD-10-CM | POA: Diagnosis not present

## 2023-07-22 ENCOUNTER — Other Ambulatory Visit (HOSPITAL_COMMUNITY): Payer: Self-pay | Admitting: Psychiatry

## 2023-07-22 DIAGNOSIS — F411 Generalized anxiety disorder: Secondary | ICD-10-CM

## 2023-07-22 DIAGNOSIS — F3181 Bipolar II disorder: Secondary | ICD-10-CM

## 2023-07-26 ENCOUNTER — Other Ambulatory Visit (HOSPITAL_COMMUNITY): Payer: Self-pay | Admitting: Psychiatry

## 2023-07-26 ENCOUNTER — Telehealth (HOSPITAL_BASED_OUTPATIENT_CLINIC_OR_DEPARTMENT_OTHER): Payer: BC Managed Care – PPO | Admitting: Psychiatry

## 2023-07-26 ENCOUNTER — Encounter (HOSPITAL_COMMUNITY): Payer: Self-pay | Admitting: Psychiatry

## 2023-07-26 VITALS — Wt 175.0 lb

## 2023-07-26 DIAGNOSIS — F3181 Bipolar II disorder: Secondary | ICD-10-CM | POA: Diagnosis not present

## 2023-07-26 DIAGNOSIS — F411 Generalized anxiety disorder: Secondary | ICD-10-CM

## 2023-07-26 MED ORDER — VILAZODONE HCL 20 MG PO TABS
20.0000 mg | ORAL_TABLET | Freq: Every day | ORAL | 0 refills | Status: DC
Start: 2023-07-26 — End: 2023-10-26

## 2023-07-26 MED ORDER — CLONAZEPAM 1 MG PO TABS
ORAL_TABLET | ORAL | 2 refills | Status: DC
Start: 2023-07-26 — End: 2023-10-26

## 2023-07-26 MED ORDER — LAMOTRIGINE 150 MG PO TABS: 300.0000 mg | ORAL_TABLET | Freq: Every day | ORAL | 0 refills | Status: DC

## 2023-07-26 NOTE — Progress Notes (Signed)
[facility] MD Virtual Progress Note   Patient Location: Office Provider Location: Home Office  I connect with patient by video and verified that I am speaking with correct person by using two identifiers. I discussed the limitations of evaluation and management by telemedicine and the availability of in person appointments. I also discussed with the patient that there may be a patient responsible charge related to this service. The patient expressed understanding and agreed to proceed.  Jermaine Kidd [id number] 57 y.o.  07/26/2023 1:49 PM  History of Present Illness:  Patient is evaluated by video session.  He stopped taking hydroxyzine because next day he feels very sedated.  He is taking Klonopin, Viibryd and Lamictal.  He has no rash, itching, tremors or shakes.  His daughter graduated and now working.  His son is in the college.  Patient told he started a new relationship 2 months ago and so far things are going well.  He denies any irritability, anger, mania, impulsive behavior or any mood swings.  He feels the current medicine is working.  His appetite is okay.  His weight is unchanged from the past.  He has no rash, itching, tremors or shakes.  He like to keep his current medication.  Past Psychiatric History: H/O irritability, impulsive behavior and depression.  No h/o inpatient or suicidal attempt.  [facility]  Saw Dr. Toni Arthurs and tried Prozac, Ambien, Cymbalta, Paxil, Wellbutrin, Lexapro, Seroquel and Klonopin.  H/O sexual side effects with most of psychotropic medication. Had [address] testing shows Viibryd, Zoloft, [place], [place], [place], [place], [place] and Ambien are better choice.  Gabapentin caused dizziness.    Outpatient Encounter Medications as of 07/26/2023  Medication Sig   clonazePAM (KLONOPIN) 1 MG tablet Take one tab at bed time.   fluorouracil (EFUDEX) 5 % cream 1 application Externally Three  times a week for 30 days   hydrOXYzine (ATARAX) 10 MG tablet Take 1-2 tablets (10-20 mg total) by mouth 3 (three) times daily as needed.   lamoTRIgine (LAMICTAL) 150 MG tablet Take 2 tablets (300 mg total) by mouth daily.   sildenafil (VIAGRA) 100 MG tablet SMARTSIG:1 Tablet(s) By Mouth As Needed   Vilazodone HCl 20 MG TABS Take 1 tablet (20 mg total) by mouth daily.   No facility-administered encounter medications on file as of 07/26/2023.    Recent Results (from the past 2160 hour(s))  POCT HgB A1C     Status: Abnormal   Collection Time: 05/04/23  2:02 PM  Result Value Ref Range   Hemoglobin A1C     HbA1c POC (<> result, manual entry) 6.1 4.0 - 5.6 %   HbA1c, POC (prediabetic range)     HbA1c, POC (controlled diabetic range)       Psychiatric Specialty Exam: Physical Exam  Review of Systems  Weight 175 lb (79.4 kg).There is no height or weight on file to calculate BMI.  General Appearance: Casual  Eye Contact:  Good  Speech:  Clear and Coherent and Normal Rate  Volume:  Normal  Mood:  Euthymic  Affect:  Appropriate  Thought Process:  Goal Directed  Orientation:  Full (Time, Place, and Person)  Thought Content:  WDL  Suicidal Thoughts:  No  Homicidal Thoughts:  No  Memory:  Immediate;   Good Recent;   Good Remote;   Good  Judgement:  Good  Insight:  Present  Psychomotor Activity:  Normal  Concentration:  Concentration: Good and Attention  Span: Good  Recall:  Good  Fund of Knowledge:  Good  Language:  Good  Akathisia:  No  Handed:  Right  AIMS (if indicated):     Assets:  Communication Skills Desire for Improvement Housing Resilience Social Support Talents/Skills Transportation  ADL's:  Intact  Cognition:  WNL  Sleep:  ok     Assessment/Plan: Bipolar II disorder, moderate, depressed, with anxious distress (HCC) - Plan: Vilazodone HCl 20 MG TABS, lamoTRIgine (LAMICTAL) 150 MG tablet  Generalized anxiety disorder - Plan: clonazePAM (KLONOPIN) 1 MG tablet,  Vilazodone HCl 20 MG TABS  Patient is stable on current medication.  He stopped taking hydroxyzine after he was feeling very sedated next day.  He sleeps okay.  Continue Klonopin 1 mg at bedtime, Lamictal 150 mg 2 times a day and Viibryd 20 mg daily.  Recommended to call us back with any question or any concern.  Discontinue hydroxyzine as patient is no longer taking it.  Follow-up in 3 months   Follow Up Instructions:     I discussed the assessment and treatment plan with the patient. The patient was provided an opportunity to ask questions and all were answered. The patient agreed with the plan and demonstrated an understanding of the instructions.   The patient was advised to call back or seek an in-person evaluation if the symptoms worsen or if the condition fails to improve as anticipated.    Collaboration of Care: Other provider involved in patient's care AEB notes are available in epic to review  Patient/Guardian was advised Release of Information must be obtained prior to any record release in order to collaborate their care with an outside provider. Patient/Guardian was advised if they have not already done so to contact the registration department to sign all necessary forms in order for [place] to release information regarding their care.   Consent: Patient/Guardian gives verbal consent for treatment and assignment of benefits for services provided during this visit. Patient/Guardian expressed understanding and agreed to proceed.     I provided 17 minutes of non face to face time during this encounter.  Note: This document was prepared by [employer] voice dictation technology and any errors that results from this process are unintentional.    Cleotis Nipper, MD 07/26/2023

## 2023-08-03 DIAGNOSIS — D239 Other benign neoplasm of skin, unspecified: Secondary | ICD-10-CM | POA: Diagnosis not present

## 2023-08-03 DIAGNOSIS — Z23 Encounter for immunization: Secondary | ICD-10-CM | POA: Diagnosis not present

## 2023-08-03 DIAGNOSIS — L820 Inflamed seborrheic keratosis: Secondary | ICD-10-CM | POA: Diagnosis not present

## 2023-08-03 DIAGNOSIS — L905 Scar conditions and fibrosis of skin: Secondary | ICD-10-CM | POA: Diagnosis not present

## 2023-10-22 ENCOUNTER — Other Ambulatory Visit (HOSPITAL_COMMUNITY): Payer: Self-pay | Admitting: Psychiatry

## 2023-10-22 DIAGNOSIS — F3181 Bipolar II disorder: Secondary | ICD-10-CM

## 2023-10-22 DIAGNOSIS — F411 Generalized anxiety disorder: Secondary | ICD-10-CM

## 2023-10-26 ENCOUNTER — Telehealth (HOSPITAL_COMMUNITY): Payer: BC Managed Care – PPO | Admitting: Psychiatry

## 2023-10-26 ENCOUNTER — Encounter (HOSPITAL_COMMUNITY): Payer: Self-pay | Admitting: Psychiatry

## 2023-10-26 VITALS — Wt 174.0 lb

## 2023-10-26 DIAGNOSIS — F411 Generalized anxiety disorder: Secondary | ICD-10-CM | POA: Diagnosis not present

## 2023-10-26 DIAGNOSIS — F3181 Bipolar II disorder: Secondary | ICD-10-CM

## 2023-10-26 MED ORDER — VILAZODONE HCL 20 MG PO TABS: 20.0000 mg | ORAL_TABLET | Freq: Every day | ORAL | 0 refills | Status: DC

## 2023-10-26 MED ORDER — LAMOTRIGINE 150 MG PO TABS: 300.0000 mg | ORAL_TABLET | Freq: Every day | ORAL | 0 refills | Status: DC

## 2023-10-26 MED ORDER — CLONAZEPAM 1 MG PO TABS
ORAL_TABLET | ORAL | 2 refills | Status: DC
Start: 2023-10-26 — End: 2024-01-24

## 2023-10-26 NOTE — Progress Notes (Signed)
 [facility] MD Virtual Progress Note   Patient Location: Work Provider Location: Home Office  I connect with patient by video and verified that I am speaking with correct person by using two identifiers. I discussed the limitations of evaluation and management by telemedicine and the availability of in person appointments. I also discussed with the patient that there may be a patient responsible charge related to this service. The patient expressed understanding and agreed to proceed.  Jermaine Kidd [id number] 58 y.o.  10/26/2023 11:40 AM  History of Present Illness:  Patient was evaluated by video session.  He is doing well on his current medication.  He denies any mania, psychosis, hallucination.  He started a new relationship with his 59 months old as previous relation trip did not work.  He is happy as Arlean was good and he was able to see the parents who came from the college on Christmas break.  He is sleeping good.  Denies any crying spells or any feeling of hopelessness or worthlessness.  Denies drinking or using any illegal substances.  He has no major concern from the medication.  He is taking Klonopin , Lamictal  and Viibryd .  He has no rash, itching tremors or shakes.  His appetite is okay.  His weight is stable.  Denies any panic attack.  His anxiety is under control.  His work is going well.  Past Psychiatric History: H/O irritability, impulsive behavior and depression.  No h/o inpatient or suicidal attempt.  [facility]  Saw Dr. Melba and tried Prozac , Ambien, Cymbalta , Paxil, Wellbutrin, Lexapro, Seroquel and Klonopin .  H/O sexual side effects with most of psychotropic medication. Had [facility] testing shows Viibryd , Zoloft, [place], Pristiq , Klonopin , BuSpar , [place] and Ambien are better choice.  Gabapentin  caused dizziness.      Outpatient Encounter Medications as of 10/26/2023  Medication Sig   clonazePAM   (KLONOPIN ) 1 MG tablet Take one tab at bed time.   fluorouracil (EFUDEX) 5 % cream 1 application Externally Three times a week for 30 days   hydrOXYzine  (ATARAX ) 10 MG tablet Take 1-2 tablets (10-20 mg total) by mouth 3 (three) times daily as needed. (Patient not taking: Reported on 07/26/2023)   lamoTRIgine  (LAMICTAL ) 150 MG tablet Take 2 tablets (300 mg total) by mouth daily.   sildenafil  (VIAGRA ) 100 MG tablet SMARTSIG:1 Tablet(s) By Mouth As Needed   Vilazodone  HCl 20 MG TABS Take 1 tablet (20 mg total) by mouth daily.   No facility-administered encounter medications on file as of 10/26/2023.    No results found for this or any previous visit (from the past 2160 hours).   Psychiatric Specialty Exam: Physical Exam  Review of Systems  Weight 174 lb (78.9 kg).There is no height or weight on file to calculate BMI.  General Appearance: Casual  Eye Contact:  Good  Speech:  Clear and Coherent  Volume:  Normal  Mood:  Euthymic  Affect:  Appropriate  Thought Process:  Goal Directed  Orientation:  Full (Time, Place, and Person)  Thought Content:  WDL  Suicidal Thoughts:  No  Homicidal Thoughts:  No  Memory:  Immediate;   Good Recent;   Good Remote;   Good  Judgement:  Intact  Insight:  Present  Psychomotor Activity:  Normal  Concentration:  Concentration: Good and Attention Span: Good  Recall:  Good  Fund of Knowledge:  Good  Language:  Good  Akathisia:  No  Handed:  Right  AIMS (if indicated):     Assets:  Communication Skills Desire for Improvement Housing Resilience Talents/Skills Transportation  ADL's:  Intact  Cognition:  WNL  Sleep: 5 to 6 hours.     Assessment/Plan: Bipolar II disorder, moderate, depressed, with anxious distress (HCC) - Plan: lamoTRIgine  (LAMICTAL ) 150 MG tablet, Vilazodone  HCl 20 MG TABS  Generalized anxiety disorder - Plan: clonazePAM  (KLONOPIN ) 1 MG tablet, Vilazodone  HCl 20 MG TABS  Patient is stable on his current medication.  He has no  major concern and his mood is stable and denies any mania or psychosis.  Continue Klonopin  1 mg at bedtime, Lamictal  150 mg 2 times a day and Viibryd  40 mg daily.  He started a new relationship with his 26 months old and going well.  Recommended to call us  back if is any question or any concern.  Follow-up in 3 months.   Follow Up Instructions:     I discussed the assessment and treatment plan with the patient. The patient was provided an opportunity to ask questions and all were answered. The patient agreed with the plan and demonstrated an understanding of the instructions.   The patient was advised to call back or seek an in-person evaluation if the symptoms worsen or if the condition fails to improve as anticipated.    Collaboration of Care: Other provider involved in patient's care AEB notes are available in epic to review  Patient/Guardian was advised Release of Information must be obtained prior to any record release in order to collaborate their care with an outside provider. Patient/Guardian was advised if they have not already done so to contact the registration department to sign all necessary forms in order for us  to release information regarding their care.   Consent: Patient/Guardian gives verbal consent for treatment and assignment of benefits for services provided during this visit. Patient/Guardian expressed understanding and agreed to proceed.     I provided 18 minutes of non face to face time during this encounter.  Note: This document was prepared by [employer] voice dictation technology and any errors that results from this process are unintentional.    Leni ONEIDA Client, MD 10/26/2023

## 2023-11-02 ENCOUNTER — Other Ambulatory Visit: Payer: Self-pay | Admitting: Urology

## 2023-11-04 DIAGNOSIS — L578 Other skin changes due to chronic exposure to nonionizing radiation: Secondary | ICD-10-CM | POA: Diagnosis not present

## 2023-11-04 DIAGNOSIS — L814 Other melanin hyperpigmentation: Secondary | ICD-10-CM | POA: Diagnosis not present

## 2023-11-04 DIAGNOSIS — L821 Other seborrheic keratosis: Secondary | ICD-10-CM | POA: Diagnosis not present

## 2023-11-04 DIAGNOSIS — D229 Melanocytic nevi, unspecified: Secondary | ICD-10-CM | POA: Diagnosis not present

## 2023-11-04 DIAGNOSIS — L570 Actinic keratosis: Secondary | ICD-10-CM | POA: Diagnosis not present

## 2023-11-08 DIAGNOSIS — R351 Nocturia: Secondary | ICD-10-CM | POA: Diagnosis not present

## 2023-11-08 DIAGNOSIS — R3912 Poor urinary stream: Secondary | ICD-10-CM | POA: Diagnosis not present

## 2023-11-08 DIAGNOSIS — N401 Enlarged prostate with lower urinary tract symptoms: Secondary | ICD-10-CM | POA: Diagnosis not present

## 2023-11-08 DIAGNOSIS — N5201 Erectile dysfunction due to arterial insufficiency: Secondary | ICD-10-CM | POA: Diagnosis not present

## 2024-01-19 ENCOUNTER — Other Ambulatory Visit (HOSPITAL_COMMUNITY): Payer: Self-pay | Admitting: Psychiatry

## 2024-01-19 DIAGNOSIS — F3181 Bipolar II disorder: Secondary | ICD-10-CM

## 2024-01-20 ENCOUNTER — Other Ambulatory Visit (HOSPITAL_COMMUNITY): Payer: Self-pay | Admitting: Psychiatry

## 2024-01-20 DIAGNOSIS — F411 Generalized anxiety disorder: Secondary | ICD-10-CM

## 2024-01-20 DIAGNOSIS — F3181 Bipolar II disorder: Secondary | ICD-10-CM

## 2024-01-24 ENCOUNTER — Encounter (HOSPITAL_COMMUNITY): Payer: Self-pay | Admitting: Psychiatry

## 2024-01-24 ENCOUNTER — Telehealth (HOSPITAL_BASED_OUTPATIENT_CLINIC_OR_DEPARTMENT_OTHER): Payer: BC Managed Care – PPO | Admitting: Psychiatry

## 2024-01-24 DIAGNOSIS — F3181 Bipolar II disorder: Secondary | ICD-10-CM | POA: Diagnosis not present

## 2024-01-24 DIAGNOSIS — F411 Generalized anxiety disorder: Secondary | ICD-10-CM

## 2024-01-24 MED ORDER — VILAZODONE HCL 20 MG PO TABS: 20.0000 mg | ORAL_TABLET | Freq: Every day | ORAL | 0 refills | Status: DC

## 2024-01-24 MED ORDER — CLONAZEPAM 1 MG PO TABS
ORAL_TABLET | ORAL | 2 refills | Status: DC
Start: 2024-01-24 — End: 2024-04-24

## 2024-01-24 MED ORDER — LAMOTRIGINE 150 MG PO TABS: 300.0000 mg | ORAL_TABLET | Freq: Every day | ORAL | 0 refills | Status: DC

## 2024-01-24 NOTE — Progress Notes (Signed)
 [facility] MD Virtual Progress Note   Patient Location: Home Provider Location: Home Office  I connect with patient by video and verified that I am speaking with correct person by using two identifiers. I discussed the limitations of evaluation and management by telemedicine and the availability of in person appointments. I also discussed with the patient that there may be a patient responsible charge related to this service. The patient expressed understanding and agreed to proceed.  Jermaine Kidd [id number] 58 y.o.  01/24/2024 10:27 AM  History of Present Illness:  Patient is evaluated by video session.  He is taking all his medication as prescribed and doing well.  He has no rash, HA, tremors or shakes.  He has chronic insomnia and sometimes sleep 4 to 6 hours.  His relationship is going well but is almost 71 months old.  Denies any irritability, anger, mania, psychosis or any hallucination.  His work is stable and doing okay.  His appetite is okay and his weight is stable.  Denies any panic attack, crying spells or any feeling of hopelessness or worthlessness.  His family doing well.  He denies drinking or using any illegal substances.  He is on Lamictal, Viibryd and Klonopin.  He has no rash, itching tremors or shakes.  Past Psychiatric History: H/O irritability, impulsive behavior and depression.  No h/o inpatient or suicidal attempt.  [facility]  Saw Dr. Toni Arthurs and tried Prozac, Ambien, Cymbalta, Paxil, Wellbutrin, Lexapro, Seroquel and Klonopin.  H/O sexual side effects with most of psychotropic medication. Had [address] testing shows Viibryd, Zoloft, [place], [place], [place], [place], [place] and Ambien are better choice.  Gabapentin caused dizziness.    Outpatient Encounter Medications as of 01/24/2024  Medication Sig   clonazePAM (KLONOPIN) 1 MG tablet Take one tab at bed time.   fluorouracil (EFUDEX) 5 % cream 1  application Externally Three times a week for 30 days   lamoTRIgine (LAMICTAL) 150 MG tablet Take 2 tablets (300 mg total) by mouth daily.   sildenafil (VIAGRA) 100 MG tablet SMARTSIG:1 Tablet(s) By Mouth As Needed   Vilazodone HCl 20 MG TABS Take 1 tablet (20 mg total) by mouth daily.   No facility-administered encounter medications on file as of 01/24/2024.    No results found for this or any previous visit (from the past 2160 hours).   Psychiatric Specialty Exam: Physical Exam  Review of Systems  There were no vitals taken for this visit.There is no height or weight on file to calculate BMI.  General Appearance: Casual  Eye Contact:  Good  Speech:  Clear and Coherent and Normal Rate  Volume:  Normal  Mood:  Euthymic  Affect:  Appropriate  Thought Process:  Goal Directed  Orientation:  Full (Time, Place, and Person)  Thought Content:  Logical  Suicidal Thoughts:  No  Homicidal Thoughts:  No  Memory:  Immediate;   Good Recent;   Good Remote;   Good  Judgement:  Good  Insight:  Present  Psychomotor Activity:  Normal  Concentration:  Concentration: Good and Attention Span: Good  Recall:  Good  Fund of Knowledge:  Good  Language:  Good  Akathisia:  No  Handed:  Right  AIMS (if indicated):     Assets:  Communication Skills Desire for Improvement Financial Resources/Insurance Housing Resilience Talents/Skills Transportation  ADL's:  Intact  Cognition:  WNL  Sleep:  fair       05/04/2023  1:42 PM 02/17/2023   11:01 AM 12/01/2022    1:22 PM 10/27/2022    9:43 AM 02/04/2018   10:25 AM  Depression screen PHQ 2/9  Decreased Interest 0 0 0 0 0  Down, Depressed, Hopeless 0 0 0 0 0  PHQ - 2 Score 0 0 0 0 0  Altered sleeping 0 0 0 0   Tired, decreased energy 0 0 0 0   Change in appetite 0 0 0 0   Feeling bad or failure about yourself  0 0 0 0   Trouble concentrating 0 0 0 0   Moving slowly or fidgety/restless 0 0 0 0   Suicidal thoughts 0 0 0 0   PHQ-9 Score 0 0 0 0    Difficult doing work/chores Not difficult at all Not difficult at all Not difficult at all Not difficult at all     Assessment/Plan: Generalized anxiety disorder - Plan: clonazePAM (KLONOPIN) 1 MG tablet, Vilazodone HCl 20 MG TABS  Bipolar II disorder, moderate, depressed, with anxious distress (HCC) - Plan: lamoTRIgine (LAMICTAL) 150 MG tablet, Vilazodone HCl 20 MG TABS  Patient reported symptoms are stable.  He is not having any major concern or side effects from the medication.  He like to keep the Klonopin 1 mg at bedtime, Lamictal 150 mg 2 times a day and Viibryd 40 mg daily.  Discussed medication side effects and benefits.  He has no rash, itching tremors or shakes.  Recommended to call us  back if is any question or any concern.  Follow-up in 3 months.   Follow Up Instructions:     I discussed the assessment and treatment plan with the patient. The patient was provided an opportunity to ask questions and all were answered. The patient agreed with the plan and demonstrated an understanding of the instructions.   The patient was advised to call back or seek an in-person evaluation if the symptoms worsen or if the condition fails to improve as anticipated.    Collaboration of Care: Other provider involved in patient's care AEB notes are available in epic to review   Patient/Guardian was advised Release of Information must be obtained prior to any record release in order to collaborate their care with an outside provider. Patient/Guardian was advised if they have not already done so to contact the registration department to sign all necessary forms in order for us  to release information regarding their care.   Consent: Patient/Guardian gives verbal consent for treatment and assignment of benefits for services provided during this visit. Patient/Guardian expressed understanding and agreed to proceed.     Total encounter time 13 minutes which includes face-to-face time, chart reviewed,  care coordination, order entry and documentation during this encounter.   Note: This document was prepared by [employer] voice dictation technology and any errors that results from this process are unintentional.    Arturo Late, MD 01/24/2024

## 2024-02-01 ENCOUNTER — Other Ambulatory Visit (HOSPITAL_COMMUNITY): Payer: Self-pay | Admitting: Psychiatry

## 2024-02-01 DIAGNOSIS — F411 Generalized anxiety disorder: Secondary | ICD-10-CM

## 2024-02-01 DIAGNOSIS — F3181 Bipolar II disorder: Secondary | ICD-10-CM

## 2024-03-08 DIAGNOSIS — M79641 Pain in right hand: Secondary | ICD-10-CM | POA: Diagnosis not present

## 2024-03-08 DIAGNOSIS — M79642 Pain in left hand: Secondary | ICD-10-CM | POA: Diagnosis not present

## 2024-04-24 ENCOUNTER — Telehealth (HOSPITAL_BASED_OUTPATIENT_CLINIC_OR_DEPARTMENT_OTHER): Admitting: Psychiatry

## 2024-04-24 ENCOUNTER — Ambulatory Visit: Payer: Self-pay

## 2024-04-24 ENCOUNTER — Encounter (HOSPITAL_COMMUNITY): Payer: Self-pay | Admitting: Psychiatry

## 2024-04-24 DIAGNOSIS — F3181 Bipolar II disorder: Secondary | ICD-10-CM | POA: Diagnosis not present

## 2024-04-24 DIAGNOSIS — F411 Generalized anxiety disorder: Secondary | ICD-10-CM | POA: Diagnosis not present

## 2024-04-24 MED ORDER — LAMOTRIGINE 150 MG PO TABS: 300.0000 mg | ORAL_TABLET | Freq: Every day | ORAL | 0 refills | Status: DC

## 2024-04-24 MED ORDER — CLONAZEPAM 1 MG PO TABS: ORAL_TABLET | ORAL | 2 refills | Status: DC

## 2024-04-24 MED ORDER — VILAZODONE HCL 20 MG PO TABS: 20.0000 mg | ORAL_TABLET | Freq: Every day | ORAL | 0 refills | Status: DC

## 2024-04-24 NOTE — Progress Notes (Signed)
 [facility] MD Virtual Progress Note   Patient Location: Home Provider Location: Home Office  I connect with patient by video and verified that I am speaking with correct person by using two identifiers. I discussed the limitations of evaluation and management by telemedicine and the availability of in person appointments. I also discussed with the patient that there may be a patient responsible charge related to this service. The patient expressed understanding and agreed to proceed.  Jermaine Kidd [id number] 58 y.o.  04/24/2024 10:29 AM  History of Present Illness:  Patient is evaluated by video session.  He is taking all his medication as prescribed.  He recently had a beach trip and now is going for 9 days cruise trip.  Patient reported relationship is going very well.  He denies any mania, psychosis, irritability or any impulsive behavior.  He sleeps 4 to 5 hours with the help of Klonopin .  He has no rash, itching tremors or shakes.  He is compliant with Lamictal  Viibryd  and Klonopin .  Denies any hopelessness or worthlessness.  Any suicidal thoughts.  His kids are good.  He wants to send all his prescription to the [facility] as sometimes [facility] does not have prescription ready.  Denies drinking or using any illegal substances.  Has not seen PCP and has no new medication added.  He does walk every day and try to keep himself busy.  Past Psychiatric History: H/O irritability, impulsive behavior and depression.  No h/o inpatient or suicidal attempt.  [facility]  Saw Dr. Melba and tried Prozac , Ambien, Cymbalta , Paxil, Wellbutrin, Lexapro, Seroquel and Klonopin .  H/O sexual side effects with most of psychotropic medication. Had [facility] testing shows Viibryd , Zoloft, [place], Pristiq , Klonopin , BuSpar , Lunesta and Ambien are better choice.  Gabapentin  caused dizziness.    Outpatient Encounter Medications as of  04/24/2024  Medication Sig   clonazePAM  (KLONOPIN ) 1 MG tablet Take one tab at bed time.   fluorouracil (EFUDEX) 5 % cream 1 application Externally Three times a week for 30 days   lamoTRIgine  (LAMICTAL ) 150 MG tablet Take 2 tablets (300 mg total) by mouth daily.   sildenafil  (VIAGRA ) 100 MG tablet SMARTSIG:1 Tablet(s) By Mouth As Needed   Vilazodone  HCl 20 MG TABS Take 1 tablet (20 mg total) by mouth daily.   No facility-administered encounter medications on file as of 04/24/2024.    No results found for this or any previous visit (from the past 2160 hours).   Psychiatric Specialty Exam: Physical Exam  Review of Systems  Weight 174 lb (78.9 kg).There is no height or weight on file to calculate BMI.  General Appearance: Casual  Eye Contact:  Good  Speech:  Clear and Coherent and Normal Rate  Volume:  Normal  Mood:  Euthymic  Affect:  Congruent  Thought Process:  Goal Directed  Orientation:  Full (Time, Place, and Person)  Thought Content:  WDL  Suicidal Thoughts:  No  Homicidal Thoughts:  No  Memory:  Immediate;   Good Recent;   Good Remote;   Good  Judgement:  Good  Insight:  Present  Psychomotor Activity:  Normal  Concentration:  Concentration: Good and Attention Span: Good  Recall:  Good  Fund of Knowledge:  Good  Language:  Good  Akathisia:  No  Handed:  Right  AIMS (if indicated):     Assets:  Communication Skills Desire for Improvement Housing Resilience Social Support Talents/Skills Transportation  ADL's:  Intact  Cognition:  WNL  Sleep:  5 hrs       05/04/2023    1:42 PM 02/17/2023   11:01 AM 12/01/2022    1:22 PM 10/27/2022    9:43 AM 02/04/2018   10:25 AM  Depression screen PHQ 2/9  Decreased Interest 0 0 0 0 0  Down, Depressed, Hopeless 0 0 0 0 0  PHQ - 2 Score 0 0 0 0 0  Altered sleeping 0 0 0 0   Tired, decreased energy 0 0 0 0   Change in appetite 0 0 0 0   Feeling bad or failure about yourself  0 0 0 0   Trouble concentrating 0 0 0 0    Moving slowly or fidgety/restless 0 0 0 0   Suicidal thoughts 0 0 0 0   PHQ-9 Score 0 0 0 0   Difficult doing work/chores Not difficult at all Not difficult at all Not difficult at all Not difficult at all     Assessment/Plan: Bipolar II disorder, moderate, depressed, with anxious distress (HCC) - Plan: lamoTRIgine  (LAMICTAL ) 150 MG tablet, Vilazodone  HCl 20 MG TABS  Generalized anxiety disorder - Plan: Vilazodone  HCl 20 MG TABS, clonazePAM  (KLONOPIN ) 1 MG tablet  Patient is stable on his current medication.  He has no side effects and feel comfortable with current psychotropic medication.  Continue Lamictal  150 mg 2 times a day, Viibryd  40 mg daily and Klonopin  1 mg at bedtime.  He is going to cruise and like to pick up his medication before Saturday.  Recommended to call back with any question or any concern.  Follow-up in 3 months.  He prefer all his medication sent to [facility] pharmacy.   Follow Up Instructions:     I discussed the assessment and treatment plan with the patient. The patient was provided an opportunity to ask questions and all were answered. The patient agreed with the plan and demonstrated an understanding of the instructions.   The patient was advised to call back or seek an in-person evaluation if the symptoms worsen or if the condition fails to improve as anticipated.    Collaboration of Care: Other provider involved in patient's care AEB notes that is available in epic to review  Patient/Guardian was advised Release of Information must be obtained prior to any record release in order to collaborate their care with an outside provider. Patient/Guardian was advised if they have not already done so to contact the registration department to sign all necessary forms in order for us  to release information regarding their care.   Consent: Patient/Guardian gives verbal consent for treatment and assignment of benefits for services provided during this visit.  Patient/Guardian expressed understanding and agreed to proceed.     Total encounter time 13 minutes which includes face-to-face time, chart reviewed, care coordination, order entry and documentation during this encounter.   Note: This document was prepared by [employer] voice dictation technology and any errors that results from this process are unintentional.    Leni ONEIDA Client, MD 04/24/2024

## 2024-04-24 NOTE — Telephone Encounter (Signed)
 FYI Only or Action Required?: FYI only for provider.  Patient was last seen in primary care on 05/04/2023 by Rollene Almarie LABOR, MD.  Called Nurse Triage reporting Dizziness.  Symptoms began several weeks ago.  Interventions attempted: Rest, hydration, or home remedies.  Symptoms are: unchanged.  Triage Disposition: See PCP When Office is Open (Within 3 Days)  Patient/caregiver understands and will follow disposition?: Yes      Copied from CRM [id number]. Topic: Clinical - Red Word Triage >> Apr 24, 2024  2:19 PM Taleah C wrote: Red Word that prompted transfer to Nurse Triage: dizziness for 2 weeks, light headed when standing Reason for Disposition  [1] MILD dizziness (e.g., vertigo; walking normally) AND [2] has NOT been evaluated by doctor (or NP/PA) for this  Answer Assessment - Initial Assessment Questions 1. DESCRIPTION: Describe your dizziness.     Spinning, and woby 2. VERTIGO: Do you feel like either you or the room is spinning or tilting?      Yes- 3. LIGHTHEADED: Do you feel lightheaded? (e.g., somewhat faint, woozy, weak upon standing)     no 4. SEVERITY: How bad is it?  Can you walk?     yes 5. ONSET:  When did the dizziness begin?     2 weeks 6. AGGRAVATING FACTORS: Does anything make it worse? (e.g., standing, change in head position)     Changing position 7. CAUSE: What do you think is causing the dizziness?     unknown 8. RECURRENT SYMPTOM: Have you had dizziness before? If Yes, ask: When was the last time? What happened that time?     This morning at the gym 9. OTHER SYMPTOMS: Do you have any other symptoms? (e.g., earache, headache, numbness, tinnitus, vomiting, weakness)     no  Protocols used: Dizziness - Vertigo-A-AH

## 2024-04-26 ENCOUNTER — Encounter: Payer: Self-pay | Admitting: Family Medicine

## 2024-04-26 ENCOUNTER — Ambulatory Visit: Admitting: Family Medicine

## 2024-04-26 VITALS — BP 102/72 | HR 70 | Temp 97.8°F | Ht 70.0 in | Wt 175.0 lb

## 2024-04-26 DIAGNOSIS — R42 Dizziness and giddiness: Secondary | ICD-10-CM

## 2024-04-26 DIAGNOSIS — R7303 Prediabetes: Secondary | ICD-10-CM | POA: Diagnosis not present

## 2024-04-26 MED ORDER — MECLIZINE HCL 25 MG PO TABS
25.0000 mg | ORAL_TABLET | Freq: Two times a day (BID) | ORAL | 0 refills | Status: AC | PRN
Start: 2024-04-26 — End: ?

## 2024-04-26 NOTE — Patient Instructions (Signed)
 Try meclizine  and stay well-hydrated.  Please schedule a follow-up visit with Dr. Rollene, your PCP,  in 3-4 weeks.

## 2024-04-26 NOTE — Progress Notes (Signed)
 Subjective:     Patient ID: Jermaine Kidd, male    DOB: 1966-03-23, 58 y.o.   MRN: [id number]  Chief Complaint  Patient presents with   Dizziness    Since June 20th , woke up in middle of night room was spinning. Happens when getting up as well    Dizziness Pertinent negatives include no abdominal pain, chest pain, chills, congestion, coughing, fever, headaches, nausea, neck pain, sore throat or vomiting.    History of Present Illness         He is here with complaints of dizziness since March 31, 2024.  States he woke up and had a spinning sensation, difficulty getting out of bed. Since then he has had episodes of dizziness with head movement.  Symptoms resolved when he is still.  Denies recent illness or new medications.  No numbness, tingling or weakness     Health Maintenance Due  Topic Date Due   DTaP/Tdap/Td (1 - Tdap) Never done   Hepatitis B Vaccines (1 of 3 - 19+ 3-dose series) Never done   Zoster Vaccines- Shingrix (1 of 2) Never done   COVID-19 Vaccine (3 - 2024-25 season) 06/13/2023    Past Medical History:  Diagnosis Date   Anxiety    Anxiety disorder    onset age 27    Past Surgical History:  Procedure Laterality Date   FACIAL RECONSTRUCTION SURGERY  2005   baseball injury   SHOULDER SURGERY  2004    Family History  Problem Relation Age of Onset   Osteoarthritis Father    Alzheimer's disease Paternal Grandmother    Alzheimer's disease Maternal Aunt    Heart disease Maternal Grandmother    Depression Son    Diabetes Neg Hx    Cancer Neg Hx    Stroke Neg Hx     Social History   Socioeconomic History   Marital status: Married    Spouse name: Delon   Number of children: 4   Years of education: college   Highest education level: Bachelor's degree (e.g., BA, AB, BS)  Occupational History   Occupation: Sales  Tobacco Use   Smoking status: Never   Smokeless tobacco: Current    Types: Snuff  Vaping Use   Vaping status: Never Used   Substance and Sexual Activity   Alcohol use: No   Drug use: No   Sexual activity: Yes    Partners: Female    Birth control/protection: None  Other Topics Concern   Not on file  Social History Narrative   Not on file   Social Drivers of Health   Financial Resource Strain: Low Risk  (05/03/2023)   Overall Financial Resource Strain (CARDIA)    Difficulty of Paying Living Expenses: Not hard at all  Food Insecurity: No Food Insecurity (05/03/2023)   Hunger Vital Sign    Worried About Running Out of Food in the Last Year: Never true    Ran Out of Food in the Last Year: Never true  Transportation Needs: No Transportation Needs (05/03/2023)   PRAPARE - Administrator, Civil Service (Medical): No    Lack of Transportation (Non-Medical): No  Physical Activity: Sufficiently Active (05/03/2023)   Exercise Vital Sign    Days of Exercise per Week: 5 days    Minutes of Exercise per Session: 90 min  Stress: Stress Concern Present (05/03/2023)   [employer] of Occupational Health - Occupational Stress Questionnaire    Feeling of Stress : To some extent  Social Connections: Unknown (05/03/2023)   Social Connection and Isolation Panel    Frequency of Communication with Friends and Family: More than three times a week    Frequency of Social Gatherings with Friends and Family: More than three times a week    Attends Religious Services: Patient declined    Database administrator or Organizations: No    Attends Engineer, structural: Not on file    Marital Status: Divorced  Intimate Partner Violence: Unknown (01/16/2022)   Received from [facility]   HITS    Physically Hurt: Not on file    Insult or Talk Down To: Not on file    Threaten Physical Harm: Not on file    Scream or Curse: Not on file    Outpatient Medications Prior to Visit  Medication Sig Dispense Refill   clonazePAM  (KLONOPIN ) 1 MG tablet Take one tab at bed time. 30 tablet 2   lamoTRIgine  (LAMICTAL ) 150  MG tablet Take 2 tablets (300 mg total) by mouth daily. 180 tablet 0   meloxicam (MOBIC) 15 MG tablet Take 15 mg by mouth daily.     sildenafil  (VIAGRA ) 100 MG tablet SMARTSIG:1 Tablet(s) By Mouth As Needed     Vilazodone  HCl 20 MG TABS Take 1 tablet (20 mg total) by mouth daily. 90 tablet 0   fluorouracil (EFUDEX) 5 % cream 1 application Externally Three times a week for 30 days     No facility-administered medications prior to visit.    No Known Allergies  Review of Systems  Constitutional:  Negative for chills, fever and malaise/fatigue.  HENT:  Negative for congestion, ear pain, sore throat and tinnitus.   Eyes:  Negative for blurred vision, double vision and photophobia.  Respiratory:  Negative for cough and shortness of breath.   Cardiovascular:  Negative for chest pain, palpitations and leg swelling.  Gastrointestinal:  Negative for abdominal pain, constipation, diarrhea, nausea and vomiting.  Genitourinary:  Negative for dysuria, frequency and urgency.  Musculoskeletal:  Negative for falls and neck pain.  Neurological:  Positive for dizziness. Negative for tingling, tremors, focal weakness, loss of consciousness and headaches.       Objective:    Physical Exam Constitutional:      General: He is not in acute distress.    Appearance: He is not ill-appearing.  HENT:     Head: Atraumatic.     Right Ear: Tympanic membrane and ear canal normal.     Left Ear: Tympanic membrane and ear canal normal.     Nose: Nose normal.     Mouth/Throat:     Mouth: Mucous membranes are moist.     Pharynx: Oropharynx is clear.  Eyes:     Extraocular Movements: Extraocular movements intact.     Conjunctiva/sclera: Conjunctivae normal.  Cardiovascular:     Rate and Rhythm: Normal rate and regular rhythm.  Pulmonary:     Effort: Pulmonary effort is normal.     Breath sounds: Normal breath sounds.  Musculoskeletal:        General: Normal range of motion.     Cervical back: Normal range  of motion and neck supple. No tenderness.     Right lower leg: No edema.     Left lower leg: No edema.  Lymphadenopathy:     Cervical: No cervical adenopathy.  Skin:    General: Skin is warm and dry.  Neurological:     General: No focal deficit present.     Mental Status:  He is alert and oriented to person, place, and time.     Cranial Nerves: No cranial nerve deficit.     Sensory: No sensory deficit.     Motor: No weakness, tremor, abnormal muscle tone or pronator drift.     Coordination: Romberg sign negative. Coordination normal. Finger-Nose-Finger Test normal.     Gait: Gait normal.  Psychiatric:        Attention and Perception: Attention normal.        Mood and Affect: Mood normal.        Speech: Speech normal.        Behavior: Behavior normal.        Thought Content: Thought content normal.      BP 102/72 (BP Location: Left Arm, Patient Position: Sitting)   Pulse 70   Temp 97.8 F (36.6 C) (Temporal)   Ht 5' 10 (1.778 m)   Wt 175 lb (79.4 kg)   SpO2 97%   BMI 25.11 kg/m  Wt Readings from Last 3 Encounters:  04/26/24 175 lb (79.4 kg)  05/04/23 175 lb (79.4 kg)  12/01/22 175 lb (79.4 kg)       Assessment & Plan:   Problem List Items Addressed This Visit     Pre-diabetes   Relevant Orders   Hemoglobin A1c   TSH   T4, free   Other Visit Diagnoses       Dizziness    -  Primary   Relevant Orders   CBC with Differential/Platelet   Comprehensive metabolic panel with GFR   TSH   T4, free      Orthostatic vitals done and he is not postural. EKG shows NSR, rate 66, LAD Benign neuro exam.  Symptoms suggest BPPV.  Meclizine  prescribed.  Handout regarding BPPV and Epley maneuvers provided.  He will follow up for any new or worsening symptoms or if not improving.  Check labs including A1c since he has hx of prediabetes. Prefers to return tomorrow since our lab is closed this afternoon  Recommend follow up with PCP in 4 weeks   I have discontinued Kayle  C. Jowers Chris's fluorouracil. I am also having him start on meclizine . Additionally, I am having him maintain his sildenafil , lamoTRIgine , Vilazodone  HCl, clonazePAM , and meloxicam.  Meds ordered this encounter  Medications   meclizine  (ANTIVERT ) 25 MG tablet    Sig: Take 1 tablet (25 mg total) by mouth 2 (two) times daily as needed for dizziness.    Dispense:  30 tablet    Refill:  0    Supervising Provider:   ROLLENE NORRIS A [[id number]]

## 2024-05-06 ENCOUNTER — Other Ambulatory Visit (HOSPITAL_COMMUNITY): Payer: Self-pay | Admitting: Psychiatry

## 2024-05-06 DIAGNOSIS — F3181 Bipolar II disorder: Secondary | ICD-10-CM

## 2024-05-09 ENCOUNTER — Telehealth: Payer: Self-pay | Admitting: Internal Medicine

## 2024-05-09 NOTE — Telephone Encounter (Signed)
**Note De-identified  [place] Obfuscation** Please advise 

## 2024-05-09 NOTE — Telephone Encounter (Unsigned)
 Copied from CRM [id number]. Topic: Referral - Request for Referral >> May 08, 2024  4:00 PM Franky GRADE wrote: Did the patient discuss referral with their provider in the last year? Yes (If No - schedule appointment) (If Yes - send message)  Appointment offered? Yes  Type of order/referral and detailed reason for visit: Physical Therapy due to vertigo.   Preference of office, provider, location: [facility]  If referral order, have you been seen by this specialty before? No (If Yes, this issue or another issue? When? Where?  Can we respond through MyChart? Yes

## 2024-05-10 ENCOUNTER — Other Ambulatory Visit: Payer: Self-pay | Admitting: Family Medicine

## 2024-05-10 DIAGNOSIS — R42 Dizziness and giddiness: Secondary | ICD-10-CM

## 2024-05-16 ENCOUNTER — Ambulatory Visit: Payer: Self-pay | Admitting: Family Medicine

## 2024-05-16 ENCOUNTER — Other Ambulatory Visit (INDEPENDENT_AMBULATORY_CARE_PROVIDER_SITE_OTHER)

## 2024-05-16 ENCOUNTER — Ambulatory Visit: Payer: Self-pay

## 2024-05-16 DIAGNOSIS — R7303 Prediabetes: Secondary | ICD-10-CM

## 2024-05-16 DIAGNOSIS — R42 Dizziness and giddiness: Secondary | ICD-10-CM

## 2024-05-16 LAB — CBC WITH DIFFERENTIAL/PLATELET
Basophils Absolute: 0 K/uL (ref 0.0–0.1)
Basophils Relative: 0.5 % (ref 0.0–3.0)
Eosinophils Absolute: 0.2 K/uL (ref 0.0–0.7)
Eosinophils Relative: 2.9 % (ref 0.0–5.0)
HCT: 45.5 % (ref 39.0–52.0)
Hemoglobin: 15.6 g/dL (ref 13.0–17.0)
Lymphocytes Relative: 18.2 % (ref 12.0–46.0)
Lymphs Abs: 1.3 K/uL (ref 0.7–4.0)
MCHC: 34.2 g/dL (ref 30.0–36.0)
MCV: 91.7 fl (ref 78.0–100.0)
Monocytes Absolute: 1.1 K/uL — ABNORMAL HIGH (ref 0.1–1.0)
Monocytes Relative: 15.9 % — ABNORMAL HIGH (ref 3.0–12.0)
Neutro Abs: 4.4 K/uL (ref 1.4–7.7)
Neutrophils Relative %: 62.5 % (ref 43.0–77.0)
Platelets: 215 K/uL (ref 150.0–400.0)
RBC: 4.96 Mil/uL (ref 4.22–5.81)
RDW: 13.6 % (ref 11.5–15.5)
WBC: 7 K/uL (ref 4.0–10.5)

## 2024-05-16 LAB — COMPREHENSIVE METABOLIC PANEL WITH GFR
ALT: 36 U/L (ref 0–53)
AST: 34 U/L (ref 0–37)
Albumin: 4.3 g/dL (ref 3.5–5.2)
Alkaline Phosphatase: 58 U/L (ref 39–117)
BUN: 25 mg/dL — ABNORMAL HIGH (ref 6–23)
CO2: 26 meq/L (ref 19–32)
Calcium: 9.6 mg/dL (ref 8.4–10.5)
Chloride: 103 meq/L (ref 96–112)
Creatinine, Ser: 1.34 mg/dL (ref 0.40–1.50)
GFR: 58.95 mL/min — ABNORMAL LOW (ref >60.00)
Glucose, Bld: 99 mg/dL (ref 70–99)
Potassium: 4.1 meq/L (ref 3.5–5.1)
Sodium: 138 meq/L (ref 135–145)
Total Bilirubin: 0.6 mg/dL (ref 0.2–1.2)
Total Protein: 7.2 g/dL (ref 6.0–8.3)

## 2024-05-16 LAB — HEMOGLOBIN A1C: Hgb A1c MFr Bld: 6.3 % (ref 4.6–6.5)

## 2024-05-16 LAB — TSH: TSH: 0.77 u[IU]/mL (ref 0.35–5.50)

## 2024-05-16 LAB — T4, FREE: Free T4: 0.82 ng/dL (ref 0.60–1.60)

## 2024-05-16 NOTE — Telephone Encounter (Signed)
 FYI Only or Action Required?: FYI only for provider.  Patient was last seen in primary care on 04/26/2024 by Lendia Boby CROME, NP-C.  Called Nurse Triage reporting Dizziness.  Symptoms began several weeks ago.  Interventions attempted: Rest, hydration, or home remedies.  Symptoms are: gradually worsening.  Triage Disposition: See PCP When Office is Open (Within 3 Days)  Patient/caregiver understands and will follow disposition?: Yes, will follow disposition  Copied from CRM #[id number]. Topic: Clinical - Red Word Triage >> May 16, 2024  8:18 AM Emylou G wrote: [facility] that prompted transfer to Nurse Triage:  vertigo ( room spins, gets up and almost falls ) and abnormal ekg Reason for Disposition  [1] MODERATE dizziness (e.g., vertigo; feels very unsteady, interferes with normal activities) AND [2] has been evaluated by doctor (or NP/PA) for this  Answer Assessment - Initial Assessment Questions 1. DESCRIPTION: Describe your dizziness.     Room spinning, everything spins 3. LIGHTHEADED: Do you feel lightheaded? (e.g., somewhat faint, woozy, weak upon standing)     Weak when standing 4. SEVERITY: How bad is it?  Can you walk?     Yes can walk without assistance 5. ONSET:  When did the dizziness begin?     6 weeks 6. AGGRAVATING FACTORS: Does anything make it worse? (e.g., standing, change in head position)     Getting up/standing 7. CAUSE: What do you think is causing the dizziness?     vertigo 9. OTHER SYMPTOMS: Do you have any other symptoms? (e.g., earache, headache, numbness, tinnitus, vomiting, weakness)     denies  Protocols used: Dizziness - Vertigo-A-AH

## 2024-05-17 ENCOUNTER — Ambulatory Visit: Admitting: Family Medicine

## 2024-05-17 ENCOUNTER — Encounter: Payer: Self-pay | Admitting: Family Medicine

## 2024-05-17 VITALS — BP 114/74 | HR 84 | Temp 97.8°F | Ht 70.0 in | Wt 180.2 lb

## 2024-05-17 DIAGNOSIS — R7303 Prediabetes: Secondary | ICD-10-CM

## 2024-05-17 DIAGNOSIS — R42 Dizziness and giddiness: Secondary | ICD-10-CM

## 2024-05-17 DIAGNOSIS — N189 Chronic kidney disease, unspecified: Secondary | ICD-10-CM | POA: Diagnosis not present

## 2024-05-17 NOTE — Progress Notes (Signed)
 Subjective:     Patient ID: Jermaine Kidd, male    DOB: Aug 29, 1966, 58 y.o.   MRN: [id number]  Chief Complaint  Patient presents with   Medical Management of Chronic Issues    Wants to discuss EKG results as well as pre diabetes results as well as vertigo.     HPI  Discussed the use of AI scribe software for clinical note transcription with the patient, who gave verbal consent to proceed.  History of Present Illness Jermaine Kidd is a 58 year old male who presents with persistent vertigo.  Vertigo - Persistent vertigo triggered by position changes, such as getting up or turning neck - Symptoms particularly noticeable when lying down to exercise - Recent cruise exacerbated vertigo symptoms - Medication for vertigo caused dry mouth without improvement in symptoms - referral to PT placed by PCP. He is awaiting call to schedule.   Prediabetes - Recent hemoglobin A1c of 6.3%  Cardiovascular findings - EKG showed left axis deviation - Family history of stroke  Renal function - Mild chronic kidney disease - Current creatinine levels are normal - Previously elevated creatinine - Low water intake      Health Maintenance Due  Topic Date Due   DTaP/Tdap/Td (1 - Tdap) Never done   Hepatitis B Vaccines (1 of 3 - 19+ 3-dose series) Never done   Zoster Vaccines- Shingrix (1 of 2) Never done   COVID-19 Vaccine (3 - 2024-25 season) 06/13/2023   INFLUENZA VACCINE  05/12/2024    Past Medical History:  Diagnosis Date   Anxiety    Anxiety disorder    onset age 72    Past Surgical History:  Procedure Laterality Date   FACIAL RECONSTRUCTION SURGERY  2005   baseball injury   SHOULDER SURGERY  2004    Family History  Problem Relation Age of Onset   Osteoarthritis Father    Alzheimer's disease Paternal Grandmother    Alzheimer's disease Maternal Aunt    Heart disease Maternal Grandmother    Depression Son    Diabetes Neg Hx    Cancer Neg Hx    Stroke Neg Hx      Social History   Socioeconomic History   Marital status: Married    Spouse name: Delon   Number of children: 4   Years of education: college   Highest education level: Bachelor's degree (e.g., BA, AB, BS)  Occupational History   Occupation: Sales  Tobacco Use   Smoking status: Never   Smokeless tobacco: Current    Types: Snuff  Vaping Use   Vaping status: Never Used  Substance and Sexual Activity   Alcohol use: No   Drug use: No   Sexual activity: Yes    Partners: Female    Birth control/protection: None  Other Topics Concern   Not on file  Social History Narrative   Not on file   Social Drivers of Health   Financial Resource Strain: Low Risk  (05/16/2024)   Overall Financial Resource Strain (CARDIA)    Difficulty of Paying Living Expenses: Not hard at all  Food Insecurity: No Food Insecurity (05/16/2024)   Hunger Vital Sign    Worried About Running Out of Food in the Last Year: Never true    Ran Out of Food in the Last Year: Never true  Transportation Needs: No Transportation Needs (05/16/2024)   PRAPARE - Administrator, Civil Service (Medical): No    Lack of Transportation (Non-Medical): No  Physical Activity: Sufficiently Active (05/16/2024)   Exercise Vital Sign    Days of Exercise per Week: 5 days    Minutes of Exercise per Session: 90 min  Stress: Stress Concern Present (05/16/2024)   [employer] of Occupational Health - Occupational Stress Questionnaire    Feeling of Stress: To some extent  Social Connections: Unknown (05/16/2024)   Social Connection and Isolation Panel    Frequency of Communication with Friends and Family: More than three times a week    Frequency of Social Gatherings with Friends and Family: More than three times a week    Attends Religious Services: Patient declined    Database administrator or Organizations: Patient declined    Attends Banker Meetings: Not on file    Marital Status: Divorced  Intimate  Partner Violence: Unknown (01/16/2022)   Received from [facility]   HITS    Physically Hurt: Not on file    Insult or Talk Down To: Not on file    Threaten Physical Harm: Not on file    Scream or Curse: Not on file    Outpatient Medications Prior to Visit  Medication Sig Dispense Refill   clonazePAM  (KLONOPIN ) 1 MG tablet Take one tab at bed time. 30 tablet 2   lamoTRIgine  (LAMICTAL ) 150 MG tablet Take 2 tablets (300 mg total) by mouth daily. 180 tablet 0   meloxicam (MOBIC) 15 MG tablet Take 15 mg by mouth daily.     sildenafil  (VIAGRA ) 100 MG tablet SMARTSIG:1 Tablet(s) By Mouth As Needed     Vilazodone  HCl 20 MG TABS Take 1 tablet (20 mg total) by mouth daily. 90 tablet 0   meclizine  (ANTIVERT ) 25 MG tablet Take 1 tablet (25 mg total) by mouth 2 (two) times daily as needed for dizziness. (Patient not taking: Reported on 05/17/2024) 30 tablet 0   No facility-administered medications prior to visit.    No Known Allergies  Review of Systems  Constitutional:  Negative for chills and fever.  HENT:  Negative for congestion, ear pain, hearing loss, sore throat and tinnitus.   Eyes:  Negative for blurred vision and double vision.  Respiratory:  Negative for shortness of breath.   Cardiovascular:  Negative for chest pain and palpitations.  Gastrointestinal:  Negative for nausea and vomiting.  Neurological:  Positive for dizziness. Negative for focal weakness and headaches.       Objective:    Physical Exam Constitutional:      General: He is not in acute distress.    Appearance: He is not ill-appearing.  HENT:     Mouth/Throat:     Mouth: Mucous membranes are moist.     Pharynx: Oropharynx is clear.  Eyes:     Extraocular Movements: Extraocular movements intact.     Conjunctiva/sclera: Conjunctivae normal.  Cardiovascular:     Rate and Rhythm: Normal rate.  Pulmonary:     Effort: Pulmonary effort is normal.  Musculoskeletal:     Cervical back: Normal range of motion and  neck supple.     Right lower leg: No edema.     Left lower leg: No edema.  Skin:    General: Skin is warm and dry.  Neurological:     General: No focal deficit present.     Mental Status: He is alert and oriented to person, place, and time.     Cranial Nerves: No cranial nerve deficit.     Motor: No weakness.     Coordination: Coordination  normal.     Gait: Gait normal.  Psychiatric:        Mood and Affect: Mood normal.        Behavior: Behavior normal.        Thought Content: Thought content normal.      BP 114/74   Pulse 84   Temp 97.8 F (36.6 C) (Oral)   Ht 5' 10 (1.778 m)   Wt 180 lb 3.2 oz (81.7 kg)   SpO2 97%   BMI 25.86 kg/m  Wt Readings from Last 3 Encounters:  05/17/24 180 lb 3.2 oz (81.7 kg)  04/26/24 175 lb (79.4 kg)  05/04/23 175 lb (79.4 kg)       Assessment & Plan:   Problem List Items Addressed This Visit     Chronic kidney disease   Pre-diabetes - Primary   Vertigo   Assessment and Plan Assessment & Plan Vertigo Persistent vertigo exacerbated by positional changes. Previous antihistamine treatment was ineffective and caused dry mouth. No new symptoms reported. - Ensure referral to outpatient rehab on [facility] for vertigo therapy. - Follow up with physical therapy for scheduling.  Prediabetes A1c at 6.3%, consistent with prediabetes. No progression to diabetes. - Advise on dietary modifications to reduce sugar and carbohydrate intake. - Encourage continued exercise regimen.  Mild chronic kidney disease Likely exacerbated by dehydration. GFR below ideal but creatinine levels are within normal range. No immediate concern for progression to severe kidney disease. - Advise increasing water intake to 6-8 glasses per day, avoiding excessive fluid intake before bed. - Educate on the importance of hydration for kidney health.  Reviewed labs and EKG findings with patient since his last visit.  At the end of the visit he asked about HPV  testing.  Currently asymptomatic.  Recent exposure to male partner with HPV on Pap smear.  Advised that there is no test for men who are without genital warts.  I am having Jermaine Kidd maintain his sildenafil , lamoTRIgine , Vilazodone  HCl, clonazePAM , meloxicam, and meclizine .  No orders of the defined types were placed in this encounter.

## 2024-05-29 NOTE — Therapy (Incomplete)
 OUTPATIENT PHYSICAL THERAPY VESTIBULAR EVALUATION     Patient Name: Jermaine Kidd MRN: [id number] DOB:November 08, 1965, 58 y.o., male Today's Date: 05/29/2024  END OF SESSION:   Past Medical History:  Diagnosis Date   Anxiety    Anxiety disorder    onset age 24   Past Surgical History:  Procedure Laterality Date   FACIAL RECONSTRUCTION SURGERY  2005   baseball injury   SHOULDER SURGERY  2004   Patient Active Problem List   Diagnosis Date Noted   Vertigo 05/17/2024   Chronic kidney disease 05/17/2024   Routine screening for STI (sexually transmitted infection) 02/19/2023   Hyperlipidemia 12/01/2022   Elevated serum creatinine 12/01/2022   Encounter for general adult medical examination with abnormal findings 10/27/2022   Erectile dysfunction 06/23/2016   Pre-diabetes 01/08/2015   Insomnia secondary to situational depression 12/12/2014   Bipolar disorder (HCC) 07/01/2014   Low serum testosterone  level 03/01/2014   Anxiety disorder 07/21/2012    PCP: Lendia Boby CROME, NP-C REFERRING PROVIDER: Lendia Boby CROME, NP-C  REFERRING DIAG: R42 (ICD-10-CM) - Dizziness  THERAPY DIAG:  No diagnosis found.  ONSET DATE: R42 (ICD-10-CM) - Dizziness  Rationale for Evaluation and Treatment: {HABREHAB:27488}  SUBJECTIVE:   SUBJECTIVE STATEMENT: *** Pt accompanied by: {accompnied:27141}  PERTINENT HISTORY: PMH: Prediabetes, Mild chronic kidney disease     Vertigo - Persistent vertigo triggered by position changes, such as getting up or turning neck - Symptoms particularly noticeable when lying down to exercise - Recent cruise exacerbated vertigo symptoms PAIN:  Are you having pain? {OPRCPAIN:27236}  PRECAUTIONS: {Therapy precautions:24002}  RED FLAGS: {PT Red Flags:29287}   WEIGHT BEARING RESTRICTIONS: {Yes ***/No:24003}  FALLS: Has patient fallen in last 6 months? {fallsyesno:27318}  LIVING ENVIRONMENT: Lives with: {OPRC lives with:25569::lives with their  family} Lives in: {Lives in:25570} Stairs: {opstairs:27293} Has following equipment at home: {Assistive devices:23999}  PLOF: {PLOF:24004}  PATIENT GOALS: ***  OBJECTIVE:  Note: Objective measures were completed at Evaluation unless otherwise noted.  DIAGNOSTIC FINDINGS: ***  COGNITION: Overall cognitive status: {cognition:24006}   SENSATION: {sensation:27233}  EDEMA:  {edema:24020}  MUSCLE TONE:  {LE tone:25568}  DTRs:  {DTR SITE:24025}  POSTURE:  {posture:25561}  Cervical ROM:    {AROM/PROM:27142} A/PROM (deg) eval  Flexion   Extension   Right lateral flexion   Left lateral flexion   Right rotation   Left rotation   (Blank rows = not tested)  STRENGTH: ***  LOWER EXTREMITY MMT:   MMT Right eval Left eval  Hip flexion    Hip abduction    Hip adduction    Hip internal rotation    Hip external rotation    Knee flexion    Knee extension    Ankle dorsiflexion    Ankle plantarflexion    Ankle inversion    Ankle eversion    (Blank rows = not tested)  BED MOBILITY:  {Bed mobility:24027}  TRANSFERS: Assistive device utilized: {Assistive devices:23999}  Sit to stand: {Levels of assistance:24026} Stand to sit: {Levels of assistance:24026} Chair to chair: {Levels of assistance:24026} Floor: {Levels of assistance:24026}  RAMP: {Levels of assistance:24026}  CURB: {Levels of assistance:24026}  GAIT: Gait pattern: {gait characteristics:25376} Distance walked: *** Assistive device utilized: {Assistive devices:23999} Level of assistance: {Levels of assistance:24026} Comments: ***  FUNCTIONAL TESTS:  {Functional tests:24029}  PATIENT SURVEYS:  {rehab surveys:24030}  VESTIBULAR ASSESSMENT:  GENERAL OBSERVATION: ***   SYMPTOM BEHAVIOR:  Subjective history: ***  Non-Vestibular symptoms: {nonvestibular symptoms:25260}  Type of dizziness: {Type of Dizziness:25255}  Frequency: ***  Duration: ***  Aggravating factors: {Aggravating  Factors:25258}  Relieving factors: {Relieving Factors:25259}  Progression of symptoms: {DESC; BETTER/WORSE:18575}  OCULOMOTOR EXAM:  Ocular Alignment: {Ocular Alignment:25262}  Ocular ROM: {RANGE OF MOTION:21649}  Spontaneous Nystagmus: {Spontaneous nystagmus:25263}  Gaze-Induced Nystagmus: {gaze-induced nystagmus:25264}  Smooth Pursuits: {smooth pursuit:25265}  Saccades: {saccades:25266}  Convergence/Divergence: *** cm   FRENZEL - FIXATION SUPRESSED:  Ocular Alignment: {Ocular Alignment:25262}  Spontaneous Nystagmus: {Spontaneous nystagmus:25263}  Gaze-Induced Nystagmus: {gaze-induced nystagmus:25264}  Horizontal head shaking - induced nystagmus: {head shaking induced nystagmus:25267}  Vertical head shaking - induced nystagmus: {head shaking induced nystagmus:25267}  Positional tests: {Positional tests:25271}  Pressure tests: {frenzel pressure tests:25268}  VESTIBULAR - OCULAR REFLEX:   Slow VOR: {slow VOR:25290}  VOR Cancellation: {vor cancellation:25291}  Head-Impulse Test: {head impulse test:25272}  Dynamic Visual Acuity: {dynamic visual acuity:25273}   POSITIONAL TESTING: {Positional tests:25271}  MOTION SENSITIVITY:  Motion Sensitivity Quotient Intensity: 0 = none, 1 = Lightheaded, 2 = Mild, 3 = Moderate, 4 = Severe, 5 = Vomiting  Intensity  1. Sitting to supine   2. Supine to L side   3. Supine to R side   4. Supine to sitting   5. L Hallpike-Dix   6. Up from L    7. R Hallpike-Dix   8. Up from R    9. Sitting, head tipped to L knee   10. Head up from L knee   11. Sitting, head tipped to R knee   12. Head up from R knee   13. Sitting head turns x5   14.Sitting head nods x5   15. In stance, 180 turn to L    16. In stance, 180 turn to R     OTHOSTATICS: {Exam; orthostatics:31331}  FUNCTIONAL GAIT: {Functional tests:24029}                                                                                                                             TREATMENT  DATE: ***   Canalith Repositioning:  {Canalith Repositioning:25283} Gaze Adaptation:  {gaze adaptation:25286} Habituation:  {habituation:25288} Other: ***  PATIENT EDUCATION: Education details: *** Person educated: {Person educated:25204} Education method: {Education Method:25205} Education comprehension: {Education Comprehension:25206}  HOME EXERCISE PROGRAM:  GOALS: Goals reviewed with patient? {yes/no:20286}  SHORT TERM GOALS: Target date: ***  *** Baseline: Goal status: {GOALSTATUS:25110}  2.  *** Baseline:  Goal status: {GOALSTATUS:25110}  3.  *** Baseline:  Goal status: {GOALSTATUS:25110}  4.  *** Baseline:  Goal status: {GOALSTATUS:25110}  5.  *** Baseline:  Goal status: {GOALSTATUS:25110}  6.  *** Baseline:  Goal status: {GOALSTATUS:25110}  LONG TERM GOALS: Target date: ***  *** Baseline:  Goal status: {GOALSTATUS:25110}  2.  *** Baseline:  Goal status: {GOALSTATUS:25110}  3.  *** Baseline:  Goal status: {GOALSTATUS:25110}  4.  *** Baseline:  Goal status: {GOALSTATUS:25110}  5.  *** Baseline:  Goal status: {GOALSTATUS:25110}  6.  *** Baseline:  Goal status: {GOALSTATUS:25110}  ASSESSMENT:  CLINICAL IMPRESSION: Patient is a *** y.o. *** who was  seen today for physical therapy evaluation and treatment for ***.   OBJECTIVE IMPAIRMENTS: {opptimpairments:25111}.   ACTIVITY LIMITATIONS: {activitylimitations:27494}  PARTICIPATION LIMITATIONS: {participationrestrictions:25113}  PERSONAL FACTORS: {Personal factors:25162} are also affecting patient's functional outcome.   REHAB POTENTIAL: {rehabpotential:25112}  CLINICAL DECISION MAKING: {clinical decision making:25114}  EVALUATION COMPLEXITY: {Evaluation complexity:25115}   PLAN:  PT FREQUENCY: {rehab frequency:25116}  PT DURATION: {rehab duration:25117}  PLANNED INTERVENTIONS: {rehab planned interventions:25118::97110-Therapeutic exercises,97530- Therapeutic  [phone]- Neuromuscular re-education,97535- Self Rjmz,02859- Manual therapy}  PLAN FOR NEXT SESSION: ***   [place] LOISE Senate, PT, DPT 05/29/2024, 7:53 AM

## 2024-05-30 ENCOUNTER — Ambulatory Visit: Attending: Family Medicine | Admitting: Physical Therapy

## 2024-05-30 DIAGNOSIS — H8113 Benign paroxysmal vertigo, bilateral: Secondary | ICD-10-CM | POA: Diagnosis not present

## 2024-05-30 DIAGNOSIS — R42 Dizziness and giddiness: Secondary | ICD-10-CM | POA: Diagnosis not present

## 2024-05-30 NOTE — Patient Instructions (Signed)
 Self Treatment for Right Posterior / Anterior Canalithiasis    Sitting on bed: 1. Turn head 45 right. (a) Lie back slowly, shoulders on pillow, head on bed. (b) Hold _20___ seconds. 2. Keeping head on bed, turn head 90 left. Hold ___20_ seconds. 3. Roll to left, head on 45 angle down toward bed. Hold _20___ seconds. 4. Sit up on left side of bed. Repeat __3__ times per session. Do _2___ sessions per day.  Copyright  VHI. All rights reserved.   Self Treatment for Left Posterior / Anterior Canalithiasis    Sitting on bed: 1. Turn head 45 left. (a) Lie back slowly, shoulders on pillow, head on bed. (b) Hold _20___ seconds. 2. Keeping head on bed, turn head 90 right. Hold _20___ seconds. 3. Roll to right, head on 45 angle down toward bed. Hold _20___ seconds. 4. Sit up on right side of bed. Repeat __3__ times per session. Do _2___ sessions per day.  Copyright  VHI. All rights reserved.    Copyright  VHI. All rights reserved.

## 2024-05-30 NOTE — Therapy (Unsigned)
 OUTPATIENT PHYSICAL THERAPY VESTIBULAR EVALUATION     Patient Name: Jermaine Kidd MRN: [id number] DOB:[birth year], 58 y.o., male Today's Date: 05/31/2024  END OF SESSION:  PT End of Session - 05/31/24 1933     Visit Number 1    Number of Visits 1   Eval only as no symptoms at current time   Authorization Type [employer]    PT Start Time 1017    PT Stop Time 1055    PT Time Calculation (min) 38 min    Activity Tolerance Patient tolerated treatment well    Behavior During Therapy WFL for tasks assessed/performed          Past Medical History:  Diagnosis Date   Anxiety    Anxiety disorder    onset age 72   Past Surgical History:  Procedure Laterality Date   FACIAL RECONSTRUCTION SURGERY  2005   baseball injury   SHOULDER SURGERY  2004   Patient Active Problem List   Diagnosis Date Noted   Vertigo 05/17/2024   Chronic kidney disease 05/17/2024   Routine screening for STI (sexually transmitted infection) 02/19/2023   Hyperlipidemia 12/01/2022   Elevated serum creatinine 12/01/2022   Encounter for general adult medical examination with abnormal findings 10/27/2022   Erectile dysfunction 06/23/2016   Pre-diabetes 01/08/2015   Insomnia secondary to situational depression 12/12/2014   Bipolar disorder (HCC) 07/01/2014   Low serum testosterone  level 03/01/2014   Anxiety disorder 07/21/2012    PCP: Lendia Boby CROME, NP-C REFERRING PROVIDER: Lendia Boby CROME, NP-C  REFERRING DIAG: R42 (ICD-10-CM) - Dizziness  THERAPY DIAG:  BPPV (benign paroxysmal positional vertigo), bilateral  ONSET DATE: March 31, 2024  Rationale for Evaluation and Treatment: Rehabilitation  SUBJECTIVE:   SUBJECTIVE STATEMENT: Pt reports he hasn't had any dizziness in past several days; took Meclizine  around July 24 before going on a cruise - has not had any dizziness since this time; pt reports a friend did a maneuver with him (Epley) and it seemed to help. Pt accompanied by: self  PERTINENT  HISTORY: PMH: Prediabetes, Mild chronic kidney disease     Vertigo - Persistent vertigo triggered by position changes, such as getting up or turning neck - Symptoms particularly noticeable when lying down to exercise - Recent cruise exacerbated vertigo symptoms  PAIN:  Are you having pain? No  PRECAUTIONS: None  PLOF: Independent  PATIENT GOALS: resolve the dizziness (if still present)  OBJECTIVE:  Note: Objective measures were completed at Evaluation unless otherwise noted.  GAIT: Gait pattern: WFL  VESTIBULAR ASSESSMENT:  GENERAL OBSERVATION: pt amb. Independently with no device - reports he has not had any dizziness in past month   SYMPTOM BEHAVIOR:  Subjective history: pt reports he was hit in the head with a medicine ball at the gym on 03-31-24: had dizziness after this incident but reports it has resolved at this time - has not had any dizziness in past month  Non-Vestibular symptoms: N/A  Type of dizziness: Spinning/Vertigo  Frequency: none in past month  Duration: seconds  Aggravating factors: Induced by position change: rolling to the left  Relieving factors: head stationary and slow movements  Progression of symptoms: better   POSITIONAL TESTING: Right Dix-Hallpike: no nystagmus Left Dix-Hallpike: no nystagmus Right Sidelying: no nystagmus Left Sidelying: no nystagmus  MOTION SENSITIVITY:  Motion Sensitivity Quotient Intensity: 0 = none, 1 = Lightheaded, 2 = Mild, 3 = Moderate, 4 = Severe, 5 = Vomiting  Intensity  1. Sitting to supine   2.  Supine to L side   3. Supine to R side   4. Supine to sitting   5. L Hallpike-Dix   6. Up from L    7. R Hallpike-Dix   8. Up from R    9. Sitting, head tipped to L knee   10. Head up from L knee   11. Sitting, head tipped to R knee   12. Head up from R knee   13. Sitting head turns x5   14.Sitting head nods x5   15. In stance, 180 turn to L    16. In stance, 180 turn to R                                                                                                                              TREATMENT DATE: 05-30-24  Self Care - see below PATIENT EDUCATION: Education details: etiology of BPPV explained to pt with article from [facility] given to pt; symptoms consistent with BPPV that has resolved at current time; educated pt in Epley maneuver with picture given - instructed to perform only with re-occurrence of BPPV - if needed Person educated: Patient Education method: Explanation and Handouts Education comprehension: verbalized understanding  HOME EXERCISE PROGRAM: N/A   GOALS: Goals reviewed with patient? N/A - EVAL only  ASSESSMENT:  CLINICAL IMPRESSION: Patient is a 58 y.o. gentleman who was seen today for physical therapy evaluation and treatment for vertigo. Pt reported dizziness has resolved as of this time; all positional testing was negative with no nystagmus noted in any test position and no dizziness provoked with any positional testing.  Pt's symptoms consistent with BPPV which has resolved at this time.  No treatment warranted at this time.  CLINICAL DECISION MAKING: Stable/uncomplicated  EVALUATION COMPLEXITY: Low   PLAN:  PT FREQUENCY: one time visit  PT DURATION: 1 week  PLANNED INTERVENTIONS: 97535- Self Care  PLAN FOR NEXT SESSION: N/A - eval only due to no c/o vertigo at this time   Deanda Ruddell Suzanne, PT 05/31/2024, 7:35 PM

## 2024-05-31 ENCOUNTER — Encounter: Payer: Self-pay | Admitting: Physical Therapy

## 2024-05-31 NOTE — Therapy (Deleted)
 OUTPATIENT PHYSICAL THERAPY VESTIBULAR EVALUATION     Patient Name: Jermaine Kidd MRN: [id number] DOB:May 23, 1966, 58 y.o., male Today's Date: 05/31/2024  END OF SESSION:  PT End of Session - 05/31/24 1933     Visit Number 1    Number of Visits 1   Eval only as no symptoms at current time   Authorization Type BC[employer]    PT Start Time 1017    PT Stop Time 1055    PT Time Calculation (min) 38 min    Activity Tolerance Patient tolerated treatment well    Behavior During Therapy [facility] for tasks assessed/performed          Past Medical History:  Diagnosis Date   Anxiety    Anxiety disorder    onset age 12   Past Surgical History:  Procedure Laterality Date   FACIAL RECONSTRUCTION SURGERY  2005   baseball injury   SHOULDER SURGERY  2004   Patient Active Problem List   Diagnosis Date Noted   Vertigo 05/17/2024   Chronic kidney disease 05/17/2024   Routine screening for STI (sexually transmitted infection) 02/19/2023   Hyperlipidemia 12/01/2022   Elevated serum creatinine 12/01/2022   Encounter for general adult medical examination with abnormal findings 10/27/2022   Erectile dysfunction 06/23/2016   Pre-diabetes 01/08/2015   Insomnia secondary to situational depression 12/12/2014   Bipolar disorder (HCC) 07/01/2014   Low serum testosterone  level 03/01/2014   Anxiety disorder 07/21/2012    PCP: Lendia Boby CROME, NP-C REFERRING PROVIDER: Lendia Boby CROME, NP-C  REFERRING DIAG: R42 (ICD-10-CM) - Dizziness  THERAPY DIAG:  BPPV (benign paroxysmal positional vertigo), bilateral - Plan: PT plan of care cert/re-cert  ONSET DATE: March 31, 2024  Rationale for Evaluation and Treatment: Rehabilitation  SUBJECTIVE:   SUBJECTIVE STATEMENT: Pt reports he hasn't had any dizziness in past several days; took Meclizine  around July 24 before going on a cruise - has not had any dizziness since this time; pt reports a friend did a maneuver with him (Epley) and it seemed to help. Pt  accompanied by: self  PERTINENT HISTORY: PMH: Prediabetes, Mild chronic kidney disease     Vertigo - Persistent vertigo triggered by position changes, such as getting up or turning neck - Symptoms particularly noticeable when lying down to exercise - Recent cruise exacerbated vertigo symptoms  PAIN:  Are you having pain? No  PRECAUTIONS: None  PLOF: Independent  PATIENT GOALS: resolve the dizziness (if still present)  OBJECTIVE:  Note: Objective measures were completed at Evaluation unless otherwise noted.  GAIT: Gait pattern: WFL  VESTIBULAR ASSESSMENT:  GENERAL OBSERVATION: pt amb. Independently with no device - reports he has not had any dizziness in past month   SYMPTOM BEHAVIOR:  Subjective history: pt reports he was hit in the head with a medicine ball at the gym on 03-31-24: had dizziness after this incident but reports it has resolved at this time - has not had any dizziness in past month  Non-Vestibular symptoms: N/A  Type of dizziness: Spinning/Vertigo  Frequency: none in past month  Duration: seconds  Aggravating factors: Induced by position change: rolling to the left  Relieving factors: head stationary and slow movements  Progression of symptoms: better   POSITIONAL TESTING: Right Dix-Hallpike: no nystagmus Left Dix-Hallpike: no nystagmus Right Sidelying: no nystagmus Left Sidelying: no nystagmus  MOTION SENSITIVITY:  Motion Sensitivity Quotient Intensity: 0 = none, 1 = Lightheaded, 2 = Mild, 3 = Moderate, 4 = Severe, 5 = Vomiting  Intensity  1. Sitting to supine   2. Supine to L side   3. Supine to R side   4. Supine to sitting   5. L Hallpike-Dix   6. Up from L    7. R Hallpike-Dix   8. Up from R    9. Sitting, head tipped to L knee   10. Head up from L knee   11. Sitting, head tipped to R knee   12. Head up from R knee   13. Sitting head turns x5   14.Sitting head nods x5   15. In stance, 180 turn to L    16. In stance, 180 turn to R                                                                                                                              TREATMENT DATE: 05-30-24  Self Care - see below PATIENT EDUCATION: Education details: etiology of BPPV explained to pt with article from [facility] given to pt; symptoms consistent with BPPV that has resolved at current time; educated pt in Epley maneuver with picture given - instructed to perform only with re-occurrence of BPPV - if needed Person educated: Patient Education method: Explanation and Handouts Education comprehension: verbalized understanding  HOME EXERCISE PROGRAM: N/A   GOALS: Goals reviewed with patient? N/A - EVAL only  ASSESSMENT:  CLINICAL IMPRESSION: Patient is a 58 y.o. gentleman who was seen today for physical therapy evaluation and treatment for vertigo. Pt reported dizziness has resolved as of this time; all positional testing was negative with no nystagmus noted in any test position and no dizziness provoked with any positional testing.  Pt's symptoms consistent with BPPV which has resolved at this time.  No treatment warranted at this time.  CLINICAL DECISION MAKING: Stable/uncomplicated  EVALUATION COMPLEXITY: Low   PLAN:  PT FREQUENCY: one time visit  PT DURATION: 1 week  PLANNED INTERVENTIONS: 97535- Self Care  PLAN FOR NEXT SESSION: N/A - eval only due to no c/o vertigo at this time   Roxanna [place], PT 05/31/2024, 7:57 PM

## 2024-07-20 ENCOUNTER — Encounter (HOSPITAL_COMMUNITY): Payer: Self-pay | Admitting: Psychiatry

## 2024-07-20 ENCOUNTER — Telehealth (HOSPITAL_BASED_OUTPATIENT_CLINIC_OR_DEPARTMENT_OTHER): Admitting: Psychiatry

## 2024-07-20 VITALS — Wt 180.0 lb

## 2024-07-20 DIAGNOSIS — F3181 Bipolar II disorder: Secondary | ICD-10-CM

## 2024-07-20 DIAGNOSIS — F411 Generalized anxiety disorder: Secondary | ICD-10-CM

## 2024-07-20 MED ORDER — VILAZODONE HCL 20 MG PO TABS: 20.0000 mg | ORAL_TABLET | Freq: Every day | ORAL | 0 refills | Status: DC

## 2024-07-20 MED ORDER — CLONAZEPAM 1 MG PO TABS: ORAL_TABLET | ORAL | 2 refills | Status: DC

## 2024-07-20 MED ORDER — LAMOTRIGINE 150 MG PO TABS: 300.0000 mg | ORAL_TABLET | Freq: Every day | ORAL | 0 refills | Status: DC

## 2024-07-20 NOTE — Progress Notes (Signed)
 [facility] MD Virtual Progress Note   Patient Location: In Car Provider Location: Office  I connect with patient by telephone and verified that I am speaking with correct person by using two identifiers. I discussed the limitations of evaluation and management by telemedicine and the availability of in person appointments. I also discussed with the patient that there may be a patient responsible charge related to this service. The patient expressed understanding and agreed to proceed.  Jermaine Kidd [id number] 58 y.o.  07/20/2024 9:29 AM  History of Present Illness:  Patient is evaluated by telephone session.  He is in the car and cannot do video session.  He reported summer was busy and good.  He traveled few places and had a good time.  He reported relationship is going very well.  Denies any irritability, anger, mania, psychosis.  Denies any major panic attack and he feels the medicine working very well for his mood.  He sleeps good.  He has no rash, itching tremors or shakes.  He denies any concern from the medication.  Recently had a blood work and labs are stable other than slight high BUN.  He is taking meloxicam for past 6 months for his pain in his hand.  He denies drinking or using any illegal substances.  His appetite is okay and his weight is stable.  Denies any hopelessness or worthlessness.  Denies any crying spells.  His work is going well.  Past Psychiatric History: H/O irritability, impulsive behavior and depression.  No h/o inpatient or suicidal attempt.  [facility].  Saw Dr. Melba and tried Prozac , Ambien, Cymbalta , Paxil, Wellbutrin, Lexapro, Seroquel and Klonopin .  H/O sexual side effects with most of psychotropic medication. Had GeneSight testing shows Viibryd , Zoloft, Fetzima, Pristiq , Klonopin , BuSpar , Lunesta and Ambien are better choice.  Gabapentin  caused dizziness.   Past Medical History:  Diagnosis  Date   Anxiety    Anxiety disorder    onset age 57    Outpatient Encounter Medications as of 07/20/2024  Medication Sig   clonazePAM  (KLONOPIN ) 1 MG tablet Take one tab at bed time.   lamoTRIgine  (LAMICTAL ) 150 MG tablet Take 2 tablets (300 mg total) by mouth daily.   meclizine  (ANTIVERT ) 25 MG tablet Take 1 tablet (25 mg total) by mouth 2 (two) times daily as needed for dizziness. (Patient not taking: Reported on 05/17/2024)   meloxicam (MOBIC) 15 MG tablet Take 15 mg by mouth daily.   sildenafil  (VIAGRA ) 100 MG tablet SMARTSIG:1 Tablet(s) By Mouth As Needed   Vilazodone  HCl 20 MG TABS Take 1 tablet (20 mg total) by mouth daily.   No facility-administered encounter medications on file as of 07/20/2024.    Recent Results (from the past 2160 hours)  T4, free     Status: None   Collection Time: 05/16/24  9:26 AM  Result Value Ref Range   Free T4 0.82 0.60 - 1.60 ng/dL    Comment: Specimens from patients who are undergoing biotin therapy and /or ingesting biotin supplements may contain high levels of biotin.  The higher biotin concentration in these specimens interferes with this Free T4 assay.  Specimens that contain high levels  of biotin may cause false high results for this Free T4 assay.  Please interpret results in light of the total clinical presentation of the patient.    TSH     Status: None   Collection Time: 05/16/24  9:26 AM  Result Value Ref Range   TSH 0.77 0.35 - 5.50 uIU/mL  Hemoglobin A1c     Status: None   Collection Time: 05/16/24  9:26 AM  Result Value Ref Range   Hgb A1c MFr Bld 6.3 4.6 - 6.5 %    Comment: Glycemic Control Guidelines for People with Diabetes:Non Diabetic:  <6%Goal of Therapy: <7%Additional Action Suggested:  >8%   Comprehensive metabolic panel with GFR     Status: Abnormal   Collection Time: 05/16/24  9:26 AM  Result Value Ref Range   Sodium 138 135 - 145 mEq/L   Potassium 4.1 3.5 - 5.1 mEq/L   Chloride 103 96 - 112 mEq/L   CO2 26 19 - 32 mEq/L    Glucose, Bld 99 70 - 99 mg/dL   BUN 25 (H) 6 - 23 mg/dL   Creatinine, Ser 8.65 0.40 - 1.50 mg/dL   Total Bilirubin 0.6 0.2 - 1.2 mg/dL   Alkaline Phosphatase 58 39 - 117 U/L   AST 34 0 - 37 U/L   ALT 36 0 - 53 U/L   Total Protein 7.2 6.0 - 8.3 g/dL   Albumin 4.3 3.5 - 5.2 g/dL   GFR 41.04 (L) >39.99 mL/min    Comment: Calculated using the CKD-EPI Creatinine Equation (2021)   Calcium 9.6 8.4 - 10.5 mg/dL  CBC with Differential/Platelet     Status: Abnormal   Collection Time: 05/16/24  9:26 AM  Result Value Ref Range   WBC 7.0 4.0 - 10.5 K/uL   RBC 4.96 4.22 - 5.81 Mil/uL   Hemoglobin 15.6 13.0 - 17.0 g/dL   HCT 54.4 60.9 - 47.9 %   MCV 91.7 78.0 - 100.0 fl   MCHC 34.2 30.0 - 36.0 g/dL   RDW 86.3 88.4 - 84.4 %   Platelets 215.0 150.0 - 400.0 K/uL   Neutrophils Relative % 62.5 43.0 - 77.0 %   Lymphocytes Relative 18.2 12.0 - 46.0 %   Monocytes Relative 15.9 (H) 3.0 - 12.0 %   Eosinophils Relative 2.9 0.0 - 5.0 %   Basophils Relative 0.5 0.0 - 3.0 %   Neutro Abs 4.4 1.4 - 7.7 K/uL   Lymphs Abs 1.3 0.7 - 4.0 K/uL   Monocytes Absolute 1.1 (H) 0.1 - 1.0 K/uL   Eosinophils Absolute 0.2 0.0 - 0.7 K/uL   Basophils Absolute 0.0 0.0 - 0.1 K/uL     Psychiatric Specialty Exam: Physical Exam  Review of Systems  Weight 180 lb (81.6 kg).There is no height or weight on file to calculate BMI.  General Appearance: NA  Eye Contact:  NA  Speech:  Clear and Coherent and Normal Rate  Volume:  Normal  Mood:  Euthymic  Affect:  Appropriate  Thought Process:  Coherent  Orientation:  Full (Time, Place, and Person)  Thought Content:  WDL  Suicidal Thoughts:  No  Homicidal Thoughts:  No  Memory:  Immediate;   Good Recent;   Good Remote;   Good  Judgement:  Good  Insight:  Present  Psychomotor Activity:  NA  Concentration:  Concentration: Good and Attention Span: Good  Recall:  Good  Fund of Knowledge:  Good  Language:  Good  Akathisia:  No  Handed:  Right  AIMS (if indicated):      Assets:  Communication Skills Desire for Improvement Housing Resilience Social Support Talents/Skills Transportation  ADL's:  Intact  Cognition:  WNL  Sleep:  ok       05/04/2023    1:42  PM 02/17/2023   11:01 AM 12/01/2022    1:22 PM 10/27/2022    9:43 AM 02/04/2018   10:25 AM  Depression screen PHQ 2/9  Decreased Interest 0 0 0 0 0  Down, Depressed, Hopeless 0 0 0 0 0  PHQ - 2 Score 0 0 0 0 0  Altered sleeping 0 0 0 0   Tired, decreased energy 0 0 0 0   Change in appetite 0 0 0 0   Feeling bad or failure about yourself  0 0 0 0   Trouble concentrating 0 0 0 0   Moving slowly or fidgety/restless 0 0 0 0   Suicidal thoughts 0 0 0 0   PHQ-9 Score 0 0 0 0   Difficult doing work/chores Not difficult at all Not difficult at all Not difficult at all Not difficult at all     Assessment/Plan: Bipolar II disorder, moderate, depressed, with anxious distress (HCC) - Plan: lamoTRIgine  (LAMICTAL ) 150 MG tablet, Vilazodone  HCl 20 MG TABS  Generalized anxiety disorder - Plan: clonazePAM  (KLONOPIN ) 1 MG tablet, Vilazodone  HCl 20 MG TABS  Patient is a stable on current medication.  Reviewed blood work results and collateral information from other provider.  Labs stable except mild elevation of BUN.  Hemoglobin A1c stable.  Patient like to keep the current medication since working well and has no side effects.  Continue Lamictal  150 mg 2 times a day, Viibryd  20 mg daily and Klonopin  1 mg at bedtime.  Recommend to call back if is any question or any concern.  Patient prefer all his medications sent to [facility] pharmacy.  Follow-up in 3 months   Follow Up Instructions:     I discussed the assessment and treatment plan with the patient. The patient was provided an opportunity to ask questions and all were answered. The patient agreed with the plan and demonstrated an understanding of the instructions.   The patient was advised to call back or seek an in-person evaluation if the symptoms  worsen or if the condition fails to improve as anticipated.    Collaboration of Care: Other provider involved in patient's care AEB notes are available in epic to review  Patient/Guardian was advised Release of Information must be obtained prior to any record release in order to collaborate their care with an outside provider. Patient/Guardian was advised if they have not already done so to contact the registration department to sign all necessary forms in order for us  to release information regarding their care.   Consent: Patient/Guardian gives verbal consent for treatment and assignment of benefits for services provided during this visit. Patient/Guardian expressed understanding and agreed to proceed.     Total encounter time 12 minutes which includes face-to-face time, chart reviewed, care coordination, order entry and documentation during this encounter.   Note: This document was prepared by [employer] voice dictation technology and any errors that results from this process are unintentional.    Leni ONEIDA Client, MD 07/20/2024

## 2024-07-21 ENCOUNTER — Other Ambulatory Visit (HOSPITAL_COMMUNITY): Payer: Self-pay | Admitting: Psychiatry

## 2024-07-21 DIAGNOSIS — F3181 Bipolar II disorder: Secondary | ICD-10-CM

## 2024-07-21 DIAGNOSIS — F411 Generalized anxiety disorder: Secondary | ICD-10-CM

## 2024-07-24 ENCOUNTER — Telehealth (HOSPITAL_COMMUNITY): Admitting: Psychiatry

## 2024-10-18 ENCOUNTER — Telehealth (HOSPITAL_COMMUNITY): Admitting: Psychiatry

## 2024-10-18 ENCOUNTER — Other Ambulatory Visit (HOSPITAL_COMMUNITY): Payer: Self-pay | Admitting: Psychiatry

## 2024-10-18 ENCOUNTER — Encounter (HOSPITAL_COMMUNITY): Payer: Self-pay | Admitting: Psychiatry

## 2024-10-18 VITALS — Wt 180.0 lb

## 2024-10-18 DIAGNOSIS — F411 Generalized anxiety disorder: Secondary | ICD-10-CM

## 2024-10-18 DIAGNOSIS — F3181 Bipolar II disorder: Secondary | ICD-10-CM

## 2024-10-18 MED ORDER — VILAZODONE HCL 20 MG PO TABS: 20.0000 mg | ORAL_TABLET | Freq: Every day | ORAL | 0 refills | Status: AC

## 2024-10-18 MED ORDER — LAMOTRIGINE 150 MG PO TABS: 300.0000 mg | ORAL_TABLET | Freq: Every day | ORAL | 0 refills | Status: AC

## 2024-10-18 MED ORDER — CLONAZEPAM 1 MG PO TABS: ORAL_TABLET | ORAL | 2 refills | Status: AC

## 2024-10-18 NOTE — Progress Notes (Signed)
 " [facility] MD Virtual Progress Note   Patient Location: Home Provider Location: Home Office  I connect with patient by telephone and verified that I am speaking with correct person by using two identifiers. I discussed the limitations of evaluation and management by telemedicine and the availability of in person appointments. I also discussed with the patient that there may be a patient responsible charge related to this service. The patient expressed understanding and agreed to proceed.  Jermaine Kidd [id number] 59 y.o.  10/18/2024 11:10 AM  History of Present Illness:  Patient is evaluated by phone session.  His camera is not working.  He reported things are going okay and seen.  He reported relationship ended because he was not happy in the relationship.  Patient told no new relationship started.  He had a good Christmas with the family and kids.  He denies any agitation, anger, psychosis, crying spells or any feeling of hopelessness or worthlessness.  He feels the medicine working.  His job is going good.  He is not interested to start a new relationship at this time.  Denies drinking or using any illegal substances.  His appetite is okay and weight is unchanged from the past.  He has no rash or any itching.  He like to keep the Lamictal  Viibryd  and Klonopin .  Past Psychiatric History: H/O irritability, impulsive behavior and depression.  No h/o inpatient or suicidal attempt.  [facility]  Saw Dr. Melba and tried Prozac , Ambien, Cymbalta , Paxil, Wellbutrin, Lexapro, Seroquel and Klonopin .  H/O sexual side effects with most of psychotropic medication. Had GeneSight testing shows Viibryd , Zoloft, Fetzima, Pristiq , Klonopin , BuSpar , Lunesta and Ambien are better choice.  Gabapentin  caused dizziness.   Past Medical History:  Diagnosis Date   Anxiety    Anxiety disorder    onset age 19    Outpatient Encounter Medications  as of 10/18/2024  Medication Sig   clonazePAM  (KLONOPIN ) 1 MG tablet Take one tab at bed time.   lamoTRIgine  (LAMICTAL ) 150 MG tablet Take 2 tablets (300 mg total) by mouth daily.   meclizine  (ANTIVERT ) 25 MG tablet Take 1 tablet (25 mg total) by mouth 2 (two) times daily as needed for dizziness. (Patient not taking: Reported on 05/17/2024)   meloxicam (MOBIC) 15 MG tablet Take 15 mg by mouth daily.   sildenafil  (VIAGRA ) 100 MG tablet SMARTSIG:1 Tablet(s) By Mouth As Needed   Vilazodone  HCl 20 MG TABS Take 1 tablet (20 mg total) by mouth daily.   No facility-administered encounter medications on file as of 10/18/2024.    No results found for this or any previous visit (from the past 2160 hours).    Psychiatric Specialty Exam: Physical Exam  Review of Systems  Weight 180 lb (81.6 kg).There is no height or weight on file to calculate BMI.  General Appearance: NA  Eye Contact:  NA  Speech:  Normal Rate  Volume:  Normal  Mood:  Euthymic  Affect:  Appropriate  Thought Process:  Coherent  Orientation:  Full (Time, Place, and Person)  Thought Content:  WDL  Suicidal Thoughts:  No  Homicidal Thoughts:  No  Memory:  Immediate;   Good Recent;   Good Remote;   Good  Judgement:  Good  Insight:  Present  Psychomotor Activity:  NA  Concentration:  Concentration: Good and Attention Span: Good  Recall:  Good  Fund of Knowledge:  Good  Language:  Good  Akathisia:  No  Handed:  Right  AIMS (if indicated):     Assets:  Communication Skills Desire for Improvement Housing Resilience Social Support Talents/Skills Transportation  ADL's:  Intact  Cognition:  WNL  Sleep:  ok       05/04/2023    1:42 PM 02/17/2023   11:01 AM 12/01/2022    1:22 PM 10/27/2022    9:43 AM 02/04/2018   10:25 AM  Depression screen PHQ 2/9  Decreased Interest 0 0 0 0 0  Down, Depressed, Hopeless 0 0 0 0 0  PHQ - 2 Score 0 0 0 0 0  Altered sleeping 0 0 0 0   Tired, decreased energy 0 0 0 0   Change in appetite  0 0 0 0   Feeling bad or failure about yourself  0 0 0 0   Trouble concentrating 0 0 0 0   Moving slowly or fidgety/restless 0 0 0 0   Suicidal thoughts 0 0 0 0   PHQ-9 Score 0  0  0  0    Difficult doing work/chores Not difficult at all Not difficult at all Not difficult at all Not difficult at all      Data saved with a previous flowsheet row definition    Assessment/Plan: Bipolar II disorder, moderate, depressed, with anxious distress (HCC) - Plan: lamoTRIgine  (LAMICTAL ) 150 MG tablet, Vilazodone  HCl 20 MG TABS  Generalized anxiety disorder - Plan: clonazePAM  (KLONOPIN ) 1 MG tablet, Vilazodone  HCl 20 MG TABS  Patient is 59 year old Caucasian employed man with history of bipolar disorder type II, generalized anxiety disorder.  Currently stable on medication.  Denies any panic attack mood swings irritability.  He reportedly stated and did because he was not happy and he has no interest to start a new relationship.  Like to keep his current medication.  Continue Viibryd  20 mg daily, Klonopin  1 mg at bedtime and Lamictal  150 mg twice a day.  Encouraged to have video visit next time and if issues with the camera then may need to have in person visit.  Patient agreed with the plan.  Will follow-up in 3 months.  Recommend to call back if is any question or any concern.  Follow Up Instructions:     I discussed the assessment and treatment plan with the patient. The patient was provided an opportunity to ask questions and all were answered. The patient agreed with the plan and demonstrated an understanding of the instructions.   The patient was advised to call back or seek an in-person evaluation if the symptoms worsen or if the condition fails to improve as anticipated.    Collaboration of Care: Other provider involved in patient's care AEB notes are available in epic to review  Patient/Guardian was advised Release of Information must be obtained prior to any record release in order to  collaborate their care with an outside provider. Patient/Guardian was advised if they have not already done so to contact the registration department to sign all necessary forms in order for us  to release information regarding their care.   Consent: Patient/Guardian gives verbal consent for treatment and assignment of benefits for services provided during this visit. Patient/Guardian expressed understanding and agreed to proceed.     Total encounter time 15 minutes which includes face-to-face time, chart reviewed, care coordination, order entry and documentation during this encounter.   Note: This document was prepared by [employer] voice dictation technology and any errors that results from this process are unintentional.  Leni ONEIDA Client, MD 10/18/2024   "

## 2025-01-16 ENCOUNTER — Telehealth (HOSPITAL_COMMUNITY): Admitting: Psychiatry
